# Patient Record
Sex: Male | Born: 1960 | Race: White | Hispanic: No | Marital: Married | Smoking: Never smoker
Health system: Southern US, Community
[De-identification: ages and names within clinical notes are randomized; demographics above are authoritative.]

## PROBLEM LIST (undated history)

## (undated) DIAGNOSIS — F419 Anxiety disorder, unspecified: Secondary | ICD-10-CM

## (undated) DIAGNOSIS — Z1211 Encounter for screening for malignant neoplasm of colon: Secondary | ICD-10-CM

## (undated) DIAGNOSIS — E039 Hypothyroidism, unspecified: Secondary | ICD-10-CM

## (undated) DIAGNOSIS — H269 Unspecified cataract: Secondary | ICD-10-CM

## (undated) DIAGNOSIS — Z87442 Personal history of urinary calculi: Secondary | ICD-10-CM

## (undated) DIAGNOSIS — H5500 Unspecified nystagmus: Secondary | ICD-10-CM

## (undated) DIAGNOSIS — N200 Calculus of kidney: Secondary | ICD-10-CM

## (undated) DIAGNOSIS — Z8619 Personal history of other infectious and parasitic diseases: Secondary | ICD-10-CM

## (undated) DIAGNOSIS — J45909 Unspecified asthma, uncomplicated: Secondary | ICD-10-CM

## (undated) HISTORY — PX: CARDIAC CATHETERIZATION: SHX172

## (undated) HISTORY — DX: Calculus of kidney: N20.0

## (undated) HISTORY — DX: Personal history of other infectious and parasitic diseases: Z86.19

## (undated) HISTORY — DX: Encounter for screening for malignant neoplasm of colon: Z12.11

## (undated) HISTORY — DX: Unspecified asthma, uncomplicated: J45.909

## (undated) HISTORY — PX: EXTRACORPOREAL SHOCK WAVE LITHOTRIPSY: SHX1557

## (undated) HISTORY — DX: Anxiety disorder, unspecified: F41.9

## (undated) HISTORY — PX: PAROTID GLAND TUMOR EXCISION: SHX5221

## (undated) HISTORY — PX: URETEROLITHOTOMY: SHX71

## (undated) HISTORY — DX: Hypothyroidism, unspecified: E03.9

## (undated) HISTORY — DX: Unspecified nystagmus: H55.00

## (undated) HISTORY — PX: OTHER SURGICAL HISTORY: SHX169

## (undated) HISTORY — DX: Unspecified cataract: H26.9

---

## 2003-08-21 ENCOUNTER — Ambulatory Visit (HOSPITAL_COMMUNITY): Admission: RE | Admit: 2003-08-21 | Discharge: 2003-08-21 | Payer: Self-pay | Admitting: Urology

## 2009-01-29 ENCOUNTER — Encounter: Payer: Self-pay | Admitting: Cardiology

## 2009-02-27 ENCOUNTER — Ambulatory Visit: Payer: Self-pay | Admitting: Cardiology

## 2009-02-27 DIAGNOSIS — E785 Hyperlipidemia, unspecified: Secondary | ICD-10-CM | POA: Insufficient documentation

## 2009-03-02 ENCOUNTER — Ambulatory Visit: Payer: Self-pay | Admitting: Cardiology

## 2009-03-02 ENCOUNTER — Ambulatory Visit: Payer: Self-pay

## 2009-03-02 DIAGNOSIS — R079 Chest pain, unspecified: Secondary | ICD-10-CM | POA: Insufficient documentation

## 2009-03-05 ENCOUNTER — Encounter: Payer: Self-pay | Admitting: Cardiology

## 2009-03-05 ENCOUNTER — Ambulatory Visit: Payer: Self-pay

## 2009-03-05 DIAGNOSIS — R9439 Abnormal result of other cardiovascular function study: Secondary | ICD-10-CM | POA: Insufficient documentation

## 2009-03-05 LAB — CONVERTED CEMR LAB: CRP, High Sensitivity: 1.6 (ref 0.00–5.00)

## 2009-03-08 ENCOUNTER — Telehealth: Payer: Self-pay | Admitting: Cardiology

## 2009-03-11 ENCOUNTER — Observation Stay (HOSPITAL_COMMUNITY): Admission: EM | Admit: 2009-03-11 | Discharge: 2009-03-12 | Payer: Self-pay | Admitting: Emergency Medicine

## 2009-03-11 ENCOUNTER — Ambulatory Visit: Payer: Self-pay | Admitting: Cardiology

## 2009-07-25 ENCOUNTER — Telehealth: Payer: Self-pay | Admitting: Cardiology

## 2009-09-17 ENCOUNTER — Telehealth: Payer: Self-pay | Admitting: Cardiology

## 2009-11-02 ENCOUNTER — Telehealth: Payer: Self-pay | Admitting: Cardiology

## 2009-12-28 ENCOUNTER — Telehealth: Payer: Self-pay | Admitting: Cardiology

## 2010-02-12 ENCOUNTER — Telehealth: Payer: Self-pay | Admitting: Cardiology

## 2010-03-29 ENCOUNTER — Telehealth: Payer: Self-pay | Admitting: Cardiology

## 2010-04-04 ENCOUNTER — Telehealth: Payer: Self-pay | Admitting: Cardiology

## 2010-04-05 ENCOUNTER — Telehealth (INDEPENDENT_AMBULATORY_CARE_PROVIDER_SITE_OTHER): Payer: Self-pay | Admitting: *Deleted

## 2010-07-09 NOTE — Progress Notes (Signed)
Summary: refill request  Phone Note Refill Request Message from:  Patient on February 12, 2010 3:30 PM  Refills Requested: Medication #1:  PAXIL 20 MG TABS 1 daily [facility] [address]   Method Requested: Telephone to Pharmacy Initial call taken by: Glynda Jaeger,  February 12, 2010 3:31 PM  Follow-up for Phone Call       Follow-up by: Judithe Modest CMA,  February 12, 2010 3:43 PM    Prescriptions: PAXIL 20 MG TABS (PAROXETINE HCL) 1 daily  #30 Tablet x 0   Entered by:   Judithe Modest CMA   Authorized by:   Marca Ancona, MD   Signed by:   Judithe Modest CMA on 02/12/2010   Method used:   [facility]ically [facility]        [facility]  [address] [id number]       [address]       [place], [place]  [address]       Ph: [phone] or [phone]       Fax: [phone]   RxID:   [id number]

## 2010-07-09 NOTE — Progress Notes (Signed)
  Phone Note Outgoing Call   Call placed by: Judithe Modest CMA,  April 05, 2010 11:45 AM Summary of Call: Called pt regarding refill request for pts paxil.  Pt at work, but spouse was concerned about pt being out of his medication and said he had been trying to get it for a few days.  Told her I would send in another 30 day supply but that patient needed PCP for this Rx in the future.  She said that she was sure her husband would not take initiative to make an appt and that she would appreciate [place] contacting PCP at [facility] and referring him.   Initial call taken by: Judithe Modest CMA,  April 05, 2010 11:50 AM  Follow-up for Phone Call       Follow-up by: Judithe Modest CMA,  April 05, 2010 11:45 AM     Appended Document:  Referral was to be made by scheduling.

## 2010-07-09 NOTE — Progress Notes (Signed)
Summary: refill  Phone Note Refill Request Message from:  Patient on April 04, 2010 2:35 PM  Refills Requested: Medication #1:  PAXIL 20 MG TABS 1 [phone]  Initial call taken by: Judie Grieve,  April 04, 2010 2:35 PM  Follow-up for Phone Call       Follow-up by: Judithe Modest CMA,  April 05, 2010 11:41 AM    Prescriptions: PAXIL 20 MG TABS (PAROXETINE HCL) 1 daily  #30 Tablet x 0   Entered by:   Judithe Modest CMA   Authorized by:   Marca Ancona, MD   Signed by:   Judithe Modest CMA on 04/05/2010   Method used:   [facility]ronically [facility]        [facility]  [address] [id number]       [address]       [place], [place]  [address]       Ph: [phone] or [phone]       Fax: [phone]   RxID:   [id number]

## 2010-07-09 NOTE — Progress Notes (Signed)
Summary: refill  Phone Note Refill Request Message from:  Patient on July 25, 2009 1:25 PM  Refills Requested: Medication #1:  PAXIL 20 MG TABS 1 daily   [facility] Requested: [facility] [address]   Method Requested: Fax to Local Pharmacy Initial call taken by: Migdalia Dk,  July 25, 2009 1:25 PM  Follow-up for Phone Call       Follow-up by: Judithe Modest CMA,  July 25, 2009 4:06 PM    Prescriptions: PAXIL 20 MG TABS (PAROXETINE HCL) 1 daily  #30 Tablet x 0   Entered by:   Judithe Modest CMA   Authorized by:   Marca Ancona, MD   Signed by:   Judithe Modest CMA on 07/25/2009   Method used:   [facility]ronically to        [facility]  [address] [id number]       [address]       [place], [place]  [address]       Ph: [phone] or [phone]       Fax: [phone]   RxID:   [id number]

## 2010-07-09 NOTE — Progress Notes (Signed)
Summary: refill  Phone Note Refill Request Message from:  Patient on Nov 02, 2009 2:20 PM  Refills Requested: Medication #1:  PAXIL 20 MG TABS 1 daily   [facility]ly Requested: [facility] months [facility] [address]   Method Requested: Fax to Local Pharmacy Initial call taken by: Migdalia Dk,  Nov 02, 2009 2:21 PM  Follow-up for Phone Call       Follow-up by: Judithe Modest CMA,  Nov 02, 2009 2:59 PM    Prescriptions: PAXIL 20 MG TABS (PAROXETINE HCL) 1 daily  #30 Tablet x 0   Entered by:   Judithe Modest CMA   Authorized by:   Marca Ancona, MD   Signed by:   Judithe Modest CMA on 11/02/2009   Method used:   Electronically to        [facility]  [address] [id number]       [address]       [place], [place]  [address]       Ph: [phone] or [phone]       Fax: [phone]   RxID:   [id number]

## 2010-07-09 NOTE — Progress Notes (Signed)
Summary: pt needs refill  Phone Note Refill Request Call back at Home Phone [phone] Message from:  [facility] [address]  Refills Requested: Medication #1:  PAXIL 20 MG TABS 1 daily Initial call taken by: Omer Jack,  December 28, 2009 1:24 PM  Follow-up for Phone Call       Follow-up by: Judithe Modest CMA,  December 28, 2009 3:08 PM    Prescriptions: PAXIL 20 MG TABS (PAROXETINE HCL) 1 daily  #30 Tablet x 0   Entered by:   Judithe Modest CMA   Authorized by:   Marca Ancona, MD   Signed by:   Judithe Modest CMA on 12/28/2009   Method used:   Electronically to        [facility]  [address] [id number]       [address]       [place], [place]  [address]       Ph: [phone] or [phone]       Fax: [phone]   RxID:   [id number]

## 2010-07-09 NOTE — Progress Notes (Signed)
Summary: REFILL  Phone Note Refill Request Message from:  Patient on September 17, 2009 10:18 AM  Refills Requested: Medication #1:  PAXIL 20 MG TABS 1 daily [facility] [address] [phone] 90 DAY SUPPLY  Initial call taken by: Judie Grieve,  September 17, 2009 10:18 AM  Follow-up for Phone Call       Follow-up by: Judithe Modest CMA,  September 17, 2009 4:20 PM    Prescriptions: PAXIL 20 MG TABS (PAROXETINE HCL) 1 daily  #30 Tablet x 0   Entered by:   Judithe Modest CMA   Authorized by:   Marca Ancona, MD   Signed by:   Judithe Modest CMA on 09/17/2009   Method used:   Electronically to        [facility]  [address] [id number]       [address]       [place], [place]  [address]       Ph: [phone] or [phone]       Fax: [phone]   RxID:   [id number]

## 2010-07-09 NOTE — Progress Notes (Signed)
Summary: refill  Phone Note Refill Request Message from:  Patient on March 29, 2010 2:28 PM  Refills Requested: Medication #1:  PAXIL 20 MG TABS 1 daily [facility] [phone]  Initial call taken by: Judie Grieve,  March 29, 2010 2:28 PM

## 2010-08-27 ENCOUNTER — Encounter: Payer: Self-pay | Admitting: Internal Medicine

## 2010-08-27 ENCOUNTER — Ambulatory Visit (INDEPENDENT_AMBULATORY_CARE_PROVIDER_SITE_OTHER): Payer: BC Managed Care – PPO | Admitting: Internal Medicine

## 2010-08-27 ENCOUNTER — Ambulatory Visit: Payer: BC Managed Care – PPO

## 2010-08-27 DIAGNOSIS — Z0000 Encounter for general adult medical examination without abnormal findings: Secondary | ICD-10-CM

## 2010-08-27 DIAGNOSIS — I208 Other forms of angina pectoris: Secondary | ICD-10-CM | POA: Insufficient documentation

## 2010-08-27 DIAGNOSIS — E785 Hyperlipidemia, unspecified: Secondary | ICD-10-CM

## 2010-08-27 DIAGNOSIS — N200 Calculus of kidney: Secondary | ICD-10-CM | POA: Insufficient documentation

## 2010-08-27 LAB — LIPID PANEL
Cholesterol: 217 mg/dL — ABNORMAL HIGH (ref 0–200)
HDL: 34.7 mg/dL — ABNORMAL LOW (ref >39.00)
Triglycerides: 117 mg/dL (ref 0.0–149.0)

## 2010-08-27 LAB — BASIC METABOLIC PANEL
BUN: 13 mg/dL (ref 6–23)
CO2: 28 mEq/L (ref 19–32)
Calcium: 9.7 mg/dL (ref 8.4–10.5)
Creatinine, Ser: 1 mg/dL (ref 0.4–1.5)
Glucose, Bld: 85 mg/dL (ref 70–99)

## 2010-08-27 LAB — LDL CHOLESTEROL, DIRECT: Direct LDL: 156.7 mg/dL

## 2010-08-27 NOTE — Progress Notes (Signed)
Subjective:    Patient ID: Kevin Kirby, male    DOB: Jan 14, 1961, 50 y.o.   MRN: [id number]  HPI  Patient presents to establish for on-going care.  He has a h/o anxiety. He had chest pain last year and was seen by Dr. Shirlee Latch. He had abnormal GXT and abnormal NST. He did start on cardiac meds but found this to be intolerable due in large part to anxiety. He had a subsequent panic attack about cardiac disease and went to ED. He was seen by Dr. Antoine Poche and did come to cardiac catherization which was normal. Reviewed old record: cath was totally normal with no blockage in any vessel. Outside lab did reveal a cholesterol 204; HDL 38 (goal 40+) LDL 148 (goal is 130 or less).  He reports that he has not tried any other medication for anxiety except for paxil. May be having some sexual dysfunction related to medication.   He reports that he has started using reading glasses, especially with computer use.    Past Medical History  Diagnosis Date  . Asthma     has outgrown to a great extent. Not on treatment  . Nephrolithiasis     has had 3 stone: 1 passed, 1 extracted, 1 ECSWL  . Anxiety   . 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  Past Surgical History  Procedure Date  . Ureteral lithotripsy    History   Social History  . Marital Status: Married    Spouse Name: N/A    Number of Children: N/A  . Years of Education: N/A   Social History Main Topics  . Smoking status: Never Smoker   . Smokeless tobacco: None  . Alcohol Use: 0.5 oz/week    1 drink(s) per week  . Drug Use: None  . Sexually Active: None   Other Topics Concern  . None   Social History Narrative  . None           Review of Systems  Constitutional: Positive for fatigue. Negative for activity change and appetite change.  HENT: Negative.   Eyes: Negative.   Respiratory: Negative.   Cardiovascular: Negative.   Gastrointestinal: Negative.   Genitourinary: Negative for urgency, frequency  and flank pain.  Musculoskeletal: Negative.   Neurological: Negative.   Hematological: Negative.   Psychiatric/Behavioral: Negative.        Objective:   Physical Exam  Constitutional: He is oriented to person, place, and time. He appears well-developed and well-nourished.  HENT:  Head: Normocephalic and atraumatic.  Right Ear: External ear normal.  Left Ear: External ear normal.  Nose: Nose normal.  Mouth/Throat: Oropharynx is clear and moist.  Eyes: Conjunctivae and EOM are normal. Pupils are equal, round, and reactive to light.  Neck: Normal range of motion. Neck supple. No tracheal deviation present. No thyromegaly present.  Cardiovascular: Normal rate, normal heart sounds and intact distal pulses.  Exam reveals no friction rub.   No murmur heard. Pulmonary/Chest: Effort normal and breath sounds normal. He exhibits no tenderness.  Abdominal: Soft. Bowel sounds are normal. He exhibits no mass. There is no guarding.  Musculoskeletal: Normal range of motion. He exhibits no edema and no tenderness.  Lymphadenopathy:    He has no cervical adenopathy.  Neurological: He is alert and oriented to person, place, and time. He has normal reflexes. Coordination normal.  Skin: Skin is warm and dry. No rash noted.  Psychiatric: He has a normal mood and  affect. His behavior is normal. Judgment and thought content normal.          Assessment & Plan:  1. Anxiety - patient currently on Paxil. He has some concerns about side effects. Did discuss possible counseling  Plan - he will return in 2 weeks and we will further discuss medication management and possible referral  2. Lipids - patient had modestly elevated LDL in '10  Plan - repeat lipid panel with recommendations to follow.

## 2010-08-28 ENCOUNTER — Telehealth: Payer: Self-pay | Admitting: *Deleted

## 2010-08-28 NOTE — Telephone Encounter (Signed)
Message copied by [facility], Nas Wafer on Wed Aug 28, 2010  1:04 PM ------      Message from: Illene Regulus E      Created: Wed Aug 28, 2010 10:26 AM       Call patient with information about test result:             Please call patient to let him know that his LDL was 156, slightly higher than last study. Will discuss at F/u ov            Thanks

## 2010-08-28 NOTE — Telephone Encounter (Signed)
Spoke with pt and informed him of lab results. Pt has follow up appt April 3rd at 3pm. Pt is aware

## 2010-09-10 ENCOUNTER — Ambulatory Visit (INDEPENDENT_AMBULATORY_CARE_PROVIDER_SITE_OTHER): Payer: BC Managed Care – PPO | Admitting: Internal Medicine

## 2010-09-10 DIAGNOSIS — F419 Anxiety disorder, unspecified: Secondary | ICD-10-CM | POA: Insufficient documentation

## 2010-09-10 DIAGNOSIS — F411 Generalized anxiety disorder: Secondary | ICD-10-CM

## 2010-09-10 DIAGNOSIS — R9439 Abnormal result of other cardiovascular function study: Secondary | ICD-10-CM

## 2010-09-10 DIAGNOSIS — E785 Hyperlipidemia, unspecified: Secondary | ICD-10-CM

## 2010-09-10 MED ORDER — SERTRALINE HCL 50 MG PO TABS: 50.0000 mg | ORAL_TABLET | Freq: Every day | ORAL | Status: AC

## 2010-09-10 NOTE — Progress Notes (Signed)
  Subjective:    Patient ID: Kevin Kirby, male    DOB: 1960/09/16, 50 y.o.   MRN: [id number]  HPIMr. Kirby was seen as a new patient about 2 weeks ago. In the interval he has resumed paxil and does report that his anxiety is better. He did have a lipid panel with an LDL 56. He presents today to discuss these two issues.   Last office note was reviewed with him and there are no additions or corrections.    Review of Systems  [all other systems reviewed and are negative       Objective:   Physical Exam  Constitutional: He is oriented to person, place, and time. He appears well-developed and well-nourished. No distress.  HENT:  Head: Normocephalic and atraumatic.  Right Ear: Tympanic membrane, external ear and ear canal normal. No decreased hearing is noted.  Left Ear: Tympanic membrane, external ear and ear canal normal. No decreased hearing is noted.  Nose: Nose normal.  Mouth/Throat: Oropharynx is clear and moist.  Eyes: Conjunctivae, EOM and lids are normal. Pupils are equal, round, and reactive to light.  Fundoscopic exam:      The right eye shows no arteriolar narrowing, no AV nicking and no papilledema.       The left eye shows no arteriolar narrowing, no AV nicking and no papilledema.       Rapid horizontal nystagmus is noted bilaterally  Neck: Normal range of motion. Neck supple. No JVD present. No tracheal deviation present. No thyromegaly present.  Cardiovascular: Normal rate, regular rhythm, normal heart sounds and intact distal pulses.  Exam reveals no friction rub.   No murmur heard. Pulmonary/Chest: Effort normal and breath sounds normal. No respiratory distress. He has no rales. He exhibits no tenderness.  Abdominal: Soft. Bowel sounds are normal. He exhibits no distension and no mass. There is no tenderness. There is no guarding.       Moderately overweight  Musculoskeletal: Normal range of motion. He exhibits no edema and no tenderness.  Lymphadenopathy:    He  has no cervical adenopathy.  Neurological: He is alert and oriented to person, place, and time. He has normal reflexes. No cranial nerve deficit. Coordination normal.  Skin: Skin is warm and dry. No rash noted.  Psychiatric: He has a normal mood and affect. His behavior is normal. Thought content normal.          Assessment & Plan:  1. Hyperlipidemia - discussed NCEP-ATP III guidelines - threshold for medical treatment being LDL 160+. He has clean coronary arteries by recent cath. He prefers life-style management.  Plan - low fat diet, regular exercise and weight loss           Repeat lab in 6 months: orders entered for Sept 3, 2012  2. Anxiety - he is doing better on SSRI, however we discussed the disadvantages of paxil with a very short half-life.  Plan - change to sertraline 50mg  once a day.

## 2010-09-12 LAB — POCT CARDIAC MARKERS
CKMB, poc: <1 ng/mL — ABNORMAL LOW (ref 1.0–8.0)
Myoglobin, poc: 73.4 ng/mL (ref 12–200)

## 2010-09-12 LAB — CBC
MCV: 91.9 fL (ref 78.0–100.0)
RBC: 4.72 MIL/uL (ref 4.22–5.81)
WBC: 6.1 10*3/uL (ref 4.0–10.5)

## 2010-09-12 LAB — BASIC METABOLIC PANEL
Chloride: 103 mEq/L (ref 96–112)
Creatinine, Ser: 1.03 mg/dL (ref 0.4–1.5)
GFR calc Af Amer: >60 mL/min (ref >60)
Potassium: 3.6 mEq/L (ref 3.5–5.1)

## 2010-09-12 LAB — DIFFERENTIAL
Eosinophils Absolute: 0.2 10*3/uL (ref 0.0–0.7)
Lymphs Abs: 1.1 10*3/uL (ref 0.7–4.0)
Monocytes Relative: 7 % (ref 3–12)
Neutrophils Relative %: 72 % (ref 43–77)

## 2010-09-12 LAB — PROTIME-INR: Prothrombin Time: 13.9 seconds (ref 11.6–15.2)

## 2010-09-12 LAB — CK TOTAL AND CKMB (NOT AT ARMC)
CK, MB: 0.6 ng/mL (ref 0.3–4.0)
CK, MB: 1.1 ng/mL (ref 0.3–4.0)
Relative Index: INVALID (ref 0.0–2.5)
Relative Index: INVALID (ref 0.0–2.5)
Total CK: 113 U/L (ref 7–232)
Total CK: 90 U/L (ref 7–232)

## 2010-09-12 LAB — TROPONIN I: Troponin I: 0.03 ng/mL (ref 0.00–0.06)

## 2012-06-08 ENCOUNTER — Ambulatory Visit (INDEPENDENT_AMBULATORY_CARE_PROVIDER_SITE_OTHER): Payer: BC Managed Care – PPO | Admitting: Emergency Medicine

## 2012-06-08 ENCOUNTER — Ambulatory Visit: Payer: BC Managed Care – PPO

## 2012-06-08 VITALS — BP 126/80 | HR 86 | Temp 97.6°F | Resp 16 | Ht 70.0 in | Wt 209.0 lb

## 2012-06-08 DIAGNOSIS — N200 Calculus of kidney: Secondary | ICD-10-CM

## 2012-06-08 DIAGNOSIS — R300 Dysuria: Secondary | ICD-10-CM

## 2012-06-08 LAB — POCT UA - MICROSCOPIC ONLY: Crystals, Ur, HPF, POC: NEGATIVE

## 2012-06-08 LAB — POCT URINALYSIS DIPSTICK
Protein, UA: 30
Spec Grav, UA: 1.025

## 2012-06-08 MED ORDER — CIPROFLOXACIN HCL 500 MG PO TABS: 500.0000 mg | ORAL_TABLET | Freq: Two times a day (BID) | ORAL | Status: DC

## 2012-06-08 NOTE — Progress Notes (Signed)
  Subjective:    Patient ID: Kevin Kirby, male    DOB: 01/01/1961, 51 y.o.   MRN: [id number]  HPI patient enters with multiple complaints his long history of kidney stones has had a basket extraction in the past and had lithotripsy in the past. He states he has known retained kidney stones. He enters with an urgency to urinate. He has had some burning when he urinates. He has no straining. He denies any discharge.    Review of Systems     Objective:   Physical Exam patient is alert and cooperative in no distress. His abdomen is soft without tenderness. There is no CVA tenderness. Genital exam reveals no testicular tenderness and no swelling. I did not see a discharge.  [facility] reading (PRIMARY) by  Dr. Cleta Alberts   Results for orders placed in visit on 06/08/12  POCT UA - MICROSCOPIC ONLY      Component Value Range   WBC, Ur, HPF, POC 8-20     RBC, urine, microscopic 16-26     Bacteria, U Microscopic 2+     Mucus, UA neg     Epithelial cells, urine per micros 1-3     Crystals, Ur, HPF, POC neg     Casts, Ur, LPF, POC neg     Yeast, UA neg    POCT URINALYSIS DIPSTICK      Component Value Range   Color, UA amber     Clarity, UA cloudy     Glucose, UA neg     Bilirubin, UA small     Ketones, UA trace     Spec Grav, UA 1.025     Blood, UA moderate     pH, UA 6.0     Protein, UA 30     Urobilinogen, UA 1.0     Nitrite, UA neg     Leukocytes, UA small (1+)     [facility] reading (PRIMARY) by  Dr. Cleta Alberts there is a 4 mm stone in the inferior pole of the left kidney       Assessment & Plan:    Urine will be cultured. He will strain his urine. Glenford Peers probe was also done. We'll treat with Cipro 500 twice a day.

## 2012-06-09 LAB — GC/CHLAMYDIA PROBE AMP, URINE: Chlamydia, Swab/Urine, PCR: NEGATIVE

## 2012-06-11 LAB — URINE CULTURE: Colony Count: >=100000

## 2012-12-11 ENCOUNTER — Emergency Department (HOSPITAL_COMMUNITY)
Admission: EM | Admit: 2012-12-11 | Discharge: 2012-12-11 | Disposition: A | Discharge status: Home / Self Care | Payer: BC Managed Care – PPO | Attending: Emergency Medicine | Admitting: Emergency Medicine

## 2012-12-11 ENCOUNTER — Emergency Department (HOSPITAL_COMMUNITY): Payer: BC Managed Care – PPO

## 2012-12-11 ENCOUNTER — Encounter (HOSPITAL_COMMUNITY): Payer: Self-pay | Admitting: Emergency Medicine

## 2012-12-11 DIAGNOSIS — Z7982 Long term (current) use of aspirin: Secondary | ICD-10-CM | POA: Insufficient documentation

## 2012-12-11 DIAGNOSIS — N23 Unspecified renal colic: Secondary | ICD-10-CM | POA: Insufficient documentation

## 2012-12-11 DIAGNOSIS — Z79899 Other long term (current) drug therapy: Secondary | ICD-10-CM | POA: Insufficient documentation

## 2012-12-11 DIAGNOSIS — J45909 Unspecified asthma, uncomplicated: Secondary | ICD-10-CM | POA: Insufficient documentation

## 2012-12-11 DIAGNOSIS — Z87442 Personal history of urinary calculi: Secondary | ICD-10-CM | POA: Insufficient documentation

## 2012-12-11 DIAGNOSIS — F411 Generalized anxiety disorder: Secondary | ICD-10-CM | POA: Insufficient documentation

## 2012-12-11 DIAGNOSIS — R1110 Vomiting, unspecified: Secondary | ICD-10-CM | POA: Insufficient documentation

## 2012-12-11 DIAGNOSIS — I209 Angina pectoris, unspecified: Secondary | ICD-10-CM | POA: Insufficient documentation

## 2012-12-11 LAB — POCT I-STAT, CHEM 8
BUN: 13 mg/dL (ref 6–23)
Creatinine, Ser: 1 mg/dL (ref 0.50–1.35)
Glucose, Bld: 111 mg/dL — ABNORMAL HIGH (ref 70–99)
Potassium: 4.1 mEq/L (ref 3.5–5.1)
Sodium: 143 mEq/L (ref 135–145)

## 2012-12-11 LAB — URINE MICROSCOPIC-ADD ON

## 2012-12-11 LAB — URINALYSIS, ROUTINE W REFLEX MICROSCOPIC
Bilirubin Urine: NEGATIVE
Glucose, UA: NEGATIVE mg/dL
Specific Gravity, Urine: 1.029 (ref 1.005–1.030)

## 2012-12-11 MED ORDER — OXYCODONE-ACETAMINOPHEN 5-325 MG PO TABS
1.0000 | ORAL_TABLET | Freq: Once | ORAL | Status: AC
  Administered 2012-12-11: 1 via ORAL
  Filled 2012-12-11: qty 1

## 2012-12-11 MED ORDER — OXYCODONE-ACETAMINOPHEN 5-325 MG PO TABS: 2.0000 | ORAL_TABLET | Freq: Four times a day (QID) | ORAL | Status: DC | PRN

## 2012-12-11 MED ORDER — ONDANSETRON 8 MG PO TBDP
8.0000 mg | ORAL_TABLET | Freq: Once | ORAL | Status: AC
  Administered 2012-12-11: 8 mg via ORAL
  Filled 2012-12-11: qty 1

## 2012-12-11 MED ORDER — ONDANSETRON HCL 8 MG PO TABS: 8.0000 mg | ORAL_TABLET | Freq: Three times a day (TID) | ORAL | Status: DC | PRN

## 2012-12-11 NOTE — ED Provider Notes (Addendum)
History    CSN: [id number] Arrival date & time 12/11/12  5284  First MD Initiated Contact with Patient 12/11/12 [phone]     Chief Complaint  Patient presents with  . Abdominal Pain   (Consider location/radiation/quality/duration/timing/severity/associated sxs/prior Treatment) HPI  complains of abdominal pain, left-sided, suprapubic, nonradiating onset approximately one week ago. It is a kidney stones had in the past. Patient reports tramadol this morning. Vomited this morning. One time. Seen [facility] 5 days ago for same complaint had CT scan which he reports was unremarkable. Presently pain is minimal. Treated himself with tramadol this morning. Denies nausea presently. Last bowel movement this morning, normal. No fever. No other associated symptoms. Nothing makes symptoms better or worse. Past Medical History  Diagnosis Date  . Asthma     has outgrown to a great extent. Not on treatment  . Nephrolithiasis     has had 3 stone: 1 passed, 1 extracted, 1 ECSWL  . Anxiety   . 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 Ureteral lithotripsy     Family History  Problem Relation Age of Onset  . Osteoarthritis Mother     DOB 48  . Deep vein thrombosis Mother   . Angina Father     decesed @ 1  . Aneurysm Sister     deceased@ 58  . Healthy Sister    History  Substance Use Topics  . Smoking status: Never Smoker   . Smokeless tobacco: Not on file  . Alcohol Use: 0.5 oz/week    1 drink(s) per week    Review of Systems  Constitutional: Negative.   HENT: Negative.   Respiratory: Negative.   Cardiovascular: Negative.   Gastrointestinal: Positive for vomiting and abdominal pain.  Musculoskeletal: Negative.   Skin: Negative.   Neurological: Negative.   Psychiatric/Behavioral: Negative.   All other systems reviewed and are negative.    Allergies  Review of patient's allergies indicates no known  allergies.  Home Medications   Current Outpatient Rx  Name  Route  Sig  Dispense  Refill  . aspirin 325 MG EC tablet   Oral   Take 325 mg by mouth daily.          . tamsulosin (FLOMAX) 0.4 MG CAPS   Oral   Take 0.4 mg by mouth daily.         . traMADol (ULTRAM) 50 MG tablet   Oral   Take 50 mg by mouth every 6 (six) hours as needed for pain.          BP 138/106  Temp(Src) 97.5 F (36.4 C)  Resp 14  SpO2 94% Physical Exam  Nursing note and vitals reviewed. Constitutional: He appears well-developed and well-nourished.  HENT:  Head: Normocephalic and atraumatic.  Eyes: Conjunctivae are normal. Pupils are equal, round, and reactive to light.  Neck: Neck supple. No tracheal deviation present. No thyromegaly present.  Cardiovascular: Normal rate and regular rhythm.   No murmur heard. Pulmonary/Chest: Effort normal and breath sounds normal.  Abdominal: Soft. Bowel sounds are normal. He exhibits no distension. There is no tenderness.  Genitourinary: Penis normal.  Scrotum normal  Musculoskeletal: Normal range of motion. He exhibits no edema and no tenderness.  Neurological: He is alert. Coordination normal.  Skin: Skin is warm and dry. No rash noted.  Psychiatric: He has a normal mood and affect.    ED Course  Procedures (including critical care time) Labs Reviewed  URINALYSIS, ROUTINE W REFLEX MICROSCOPIC   1:10 PM pain remains under control. No results found. No diagnosis found. Results for orders placed during the hospital encounter of 12/11/12  URINALYSIS, ROUTINE W REFLEX MICROSCOPIC      Result Value Range   Color, Urine YELLOW  YELLOW   APPearance CLEAR  CLEAR   Specific Gravity, Urine 1.029  1.005 - 1.030   pH 6.0  5.0 - 8.0   Glucose, UA NEGATIVE  NEGATIVE mg/dL   Hgb urine dipstick MODERATE (*) NEGATIVE   Bilirubin Urine NEGATIVE  NEGATIVE   Ketones, ur NEGATIVE  NEGATIVE mg/dL   Protein, ur 30 (*) NEGATIVE mg/dL   Urobilinogen, UA 0.2  0.0 - 1.0  mg/dL   Nitrite NEGATIVE  NEGATIVE   Leukocytes, UA NEGATIVE  NEGATIVE  URINE MICROSCOPIC-ADD ON      Result Value Range   Squamous Epithelial / LPF RARE  RARE   RBC / HPF 21-50  <3 RBC/hpf   Urine-Other MUCOUS PRESENT    POCT I-STAT, CHEM 8      Result Value Range   Sodium 143  135 - 145 mEq/L   Potassium 4.1  3.5 - 5.1 mEq/L   Chloride 108  96 - 112 mEq/L   BUN 13  6 - 23 mg/dL   Creatinine, Ser 1.61  0.50 - 1.35 mg/dL   Glucose, Bld 096 (*) 70 - 99 mg/dL   Calcium, Ion 0.45  4.09 - 1.23 mmol/L   TCO2 25  0 - 100 mmol/L   Hemoglobin 16.3  13.0 - 17.0 g/dL   HCT 81.1  91.4 - 78.2 %   US Renal  12/11/2012   *RADIOLOGY REPORT*  Clinical Data: Left flank pain, lithotripsy, kidney stones  RENAL/URINARY TRACT ULTRASOUND COMPLETE  Comparison:  None.  Findings:  Right Kidney:  Measures 11.5 cm.  No mass or hydronephrosis.  Left Kidney:  Measures 12.4 cm.  7 mm lower pole calculus.  Mild left hydronephrosis.  Bladder:  Within normal limits.  Bilateral bladder jets visualized.  IMPRESSION: 7 mm left lower pole renal calculus.  Mild left hydronephrosis.  Bilateral bladder jets visualized.   Original Report Authenticated By: Charline Bills, M.D.    MDM  Patient likely suffering ureteral colic i.e. hydronephrosis seen on renal ultrasound microscopic hematuria and colicky type pain. History of kidney stone. Plan prescription Zofran, Percocet Followup Dr. Margarita Grizzle next week. Diagnosis ureteral colic  Doug Sou, MD 12/11/12 1314  Doug Sou, MD 12/12/12 [phone]

## 2012-12-11 NOTE — ED Notes (Addendum)
Patient reports that last Monday he visited a urologist under the belief that the left lower abdominal pain was due to a kidney stone, which the MD did not believe was the cause. Patient vomited once this morning. Patient denies N/D, denies flank pain, denies dysuria.

## 2012-12-21 ENCOUNTER — Other Ambulatory Visit: Payer: Self-pay | Admitting: Urology

## 2012-12-23 ENCOUNTER — Other Ambulatory Visit: Payer: Self-pay | Admitting: Urology

## 2012-12-23 ENCOUNTER — Encounter (HOSPITAL_BASED_OUTPATIENT_CLINIC_OR_DEPARTMENT_OTHER): Payer: Self-pay | Admitting: *Deleted

## 2012-12-24 ENCOUNTER — Encounter (HOSPITAL_BASED_OUTPATIENT_CLINIC_OR_DEPARTMENT_OTHER): Payer: Self-pay | Admitting: *Deleted

## 2012-12-24 NOTE — Progress Notes (Signed)
NPO AFTER MN WITH EXCEPTION CLEAR LIQUIDS UNTIL 0800 (NO CREAM/ MILK PRODUCTS). ARRIVES AT 1245. NEEDS HG. MAY TAKE OXYCODONE OR TRAMADOL/ ZOFRAN IF NEEDED W/ SIPS OF WATER AM OF SURG .

## 2012-12-27 ENCOUNTER — Ambulatory Visit (HOSPITAL_BASED_OUTPATIENT_CLINIC_OR_DEPARTMENT_OTHER): Admission: RE | Admit: 2012-12-27 | Payer: BC Managed Care – PPO | Source: Ambulatory Visit | Admitting: Urology

## 2012-12-27 ENCOUNTER — Encounter (HOSPITAL_BASED_OUTPATIENT_CLINIC_OR_DEPARTMENT_OTHER): Admission: RE | Payer: Self-pay | Source: Ambulatory Visit

## 2012-12-27 HISTORY — DX: Personal history of urinary calculi: Z87.442

## 2012-12-27 SURGERY — CYSTOSCOPY, WITH STENT INSERTION
Anesthesia: General | Laterality: Left

## 2012-12-28 ENCOUNTER — Other Ambulatory Visit: Payer: Self-pay | Admitting: Urology

## 2012-12-29 ENCOUNTER — Encounter (HOSPITAL_BASED_OUTPATIENT_CLINIC_OR_DEPARTMENT_OTHER)
Admission: RE | Disposition: A | Discharge status: Home / Self Care | Payer: Self-pay | Source: Ambulatory Visit | Attending: Urology

## 2012-12-29 ENCOUNTER — Ambulatory Visit (HOSPITAL_BASED_OUTPATIENT_CLINIC_OR_DEPARTMENT_OTHER)
Admission: RE | Admit: 2012-12-29 | Discharge: 2012-12-29 | Disposition: A | Discharge status: Home / Self Care | Payer: BC Managed Care – PPO | Source: Ambulatory Visit | Attending: Urology | Admitting: Urology

## 2012-12-29 ENCOUNTER — Encounter (HOSPITAL_BASED_OUTPATIENT_CLINIC_OR_DEPARTMENT_OTHER): Payer: Self-pay | Admitting: Anesthesiology

## 2012-12-29 ENCOUNTER — Ambulatory Visit (HOSPITAL_COMMUNITY): Payer: BC Managed Care – PPO

## 2012-12-29 ENCOUNTER — Encounter (HOSPITAL_BASED_OUTPATIENT_CLINIC_OR_DEPARTMENT_OTHER): Payer: Self-pay | Admitting: *Deleted

## 2012-12-29 ENCOUNTER — Ambulatory Visit (HOSPITAL_BASED_OUTPATIENT_CLINIC_OR_DEPARTMENT_OTHER): Payer: BC Managed Care – PPO | Admitting: Anesthesiology

## 2012-12-29 DIAGNOSIS — N135 Crossing vessel and stricture of ureter without hydronephrosis: Secondary | ICD-10-CM | POA: Insufficient documentation

## 2012-12-29 DIAGNOSIS — S3710XA Unspecified injury of ureter, initial encounter: Secondary | ICD-10-CM | POA: Insufficient documentation

## 2012-12-29 DIAGNOSIS — X58XXXA Exposure to other specified factors, initial encounter: Secondary | ICD-10-CM | POA: Insufficient documentation

## 2012-12-29 DIAGNOSIS — N200 Calculus of kidney: Secondary | ICD-10-CM

## 2012-12-29 HISTORY — PX: CYSTOSCOPY WITH RETROGRADE PYELOGRAM, URETEROSCOPY AND STENT PLACEMENT: SHX5789

## 2012-12-29 SURGERY — CYSTOURETEROSCOPY, WITH RETROGRADE PYELOGRAM AND STENT INSERTION
Anesthesia: General | Site: Ureter | Laterality: Left | Wound class: Clean Contaminated

## 2012-12-29 MED ORDER — LACTATED RINGERS IV SOLN
INTRAVENOUS | Status: DC
  Administered 2012-12-29 (×2): via INTRAVENOUS
  Filled 2012-12-29: qty 1000

## 2012-12-29 MED ORDER — BELLADONNA ALKALOIDS-OPIUM 16.2-60 MG RE SUPP
RECTAL | Status: DC | PRN
  Administered 2012-12-29: 1 via RECTAL

## 2012-12-29 MED ORDER — PROMETHAZINE HCL 25 MG/ML IJ SOLN
6.2500 mg | INTRAMUSCULAR | Status: DC | PRN
  Filled 2012-12-29: qty 1

## 2012-12-29 MED ORDER — ONDANSETRON HCL 4 MG/2ML IJ SOLN
INTRAMUSCULAR | Status: DC | PRN
  Administered 2012-12-29: 4 mg via INTRAVENOUS

## 2012-12-29 MED ORDER — MEPERIDINE HCL 25 MG/ML IJ SOLN
6.2500 mg | INTRAMUSCULAR | Status: DC | PRN
  Filled 2012-12-29: qty 1

## 2012-12-29 MED ORDER — OXYBUTYNIN CHLORIDE 5 MG PO TABS
5.0000 mg | ORAL_TABLET | Freq: Four times a day (QID) | ORAL | Status: DC | PRN
  Administered 2012-12-29: 5 mg via ORAL
  Filled 2012-12-29: qty 1

## 2012-12-29 MED ORDER — DEXAMETHASONE SODIUM PHOSPHATE 4 MG/ML IJ SOLN
INTRAMUSCULAR | Status: DC | PRN
  Administered 2012-12-29: 8 mg via INTRAVENOUS

## 2012-12-29 MED ORDER — SODIUM CHLORIDE 0.9 % IR SOLN
Status: DC | PRN
  Administered 2012-12-29: 6000 mL

## 2012-12-29 MED ORDER — PHENAZOPYRIDINE HCL 200 MG PO TABS
200.0000 mg | ORAL_TABLET | Freq: Three times a day (TID) | ORAL | Status: DC | PRN
Start: 2012-12-29 — End: 2016-12-26

## 2012-12-29 MED ORDER — ONDANSETRON HCL 4 MG PO TABS: 4.0000 mg | ORAL_TABLET | Freq: Four times a day (QID) | ORAL | Status: DC | PRN

## 2012-12-29 MED ORDER — FENTANYL CITRATE 0.05 MG/ML IJ SOLN
INTRAMUSCULAR | Status: DC | PRN
  Administered 2012-12-29 (×2): 50 ug via INTRAVENOUS

## 2012-12-29 MED ORDER — LIDOCAINE HCL (CARDIAC) 20 MG/ML IV SOLN
INTRAVENOUS | Status: DC | PRN
  Administered 2012-12-29: 50 mg via INTRAVENOUS

## 2012-12-29 MED ORDER — PHENAZOPYRIDINE HCL 200 MG PO TABS
200.0000 mg | ORAL_TABLET | Freq: Three times a day (TID) | ORAL | Status: DC
  Administered 2012-12-29: 200 mg via ORAL
  Filled 2012-12-29: qty 1

## 2012-12-29 MED ORDER — OXYCODONE HCL 5 MG/5ML PO SOLN
5.0000 mg | Freq: Once | ORAL | Status: AC | PRN
  Filled 2012-12-29: qty 5

## 2012-12-29 MED ORDER — PROPOFOL 10 MG/ML IV BOLUS
INTRAVENOUS | Status: DC | PRN
  Administered 2012-12-29: 200 mg via INTRAVENOUS

## 2012-12-29 MED ORDER — OXYCODONE HCL 5 MG PO TABS
5.0000 mg | ORAL_TABLET | Freq: Once | ORAL | Status: AC | PRN
  Administered 2012-12-29: 5 mg via ORAL
  Filled 2012-12-29: qty 1

## 2012-12-29 MED ORDER — ACETAMINOPHEN 10 MG/ML IV SOLN
1000.0000 mg | Freq: Once | INTRAVENOUS | Status: DC | PRN
  Filled 2012-12-29: qty 100

## 2012-12-29 MED ORDER — OXYBUTYNIN CHLORIDE 5 MG PO TABS: 5.0000 mg | ORAL_TABLET | Freq: Four times a day (QID) | ORAL | Status: DC | PRN

## 2012-12-29 MED ORDER — LIDOCAINE HCL 2 % EX GEL
CUTANEOUS | Status: DC | PRN
  Administered 2012-12-29: 1

## 2012-12-29 MED ORDER — MIDAZOLAM HCL 5 MG/5ML IJ SOLN
INTRAMUSCULAR | Status: DC | PRN
  Administered 2012-12-29: 2 mg via INTRAVENOUS

## 2012-12-29 MED ORDER — HYDROMORPHONE HCL PF 1 MG/ML IJ SOLN
0.2500 mg | INTRAMUSCULAR | Status: DC | PRN
  Administered 2012-12-29: 0.5 mg via INTRAVENOUS
  Filled 2012-12-29: qty 1

## 2012-12-29 MED ORDER — OXYCODONE-ACETAMINOPHEN 5-325 MG PO TABS: 2.0000 | ORAL_TABLET | ORAL | Status: DC | PRN

## 2012-12-29 MED ORDER — HYOSCYAMINE SULFATE 0.125 MG PO TABS: 0.1250 mg | ORAL_TABLET | ORAL | Status: DC | PRN

## 2012-12-29 MED ORDER — CEFAZOLIN SODIUM-DEXTROSE 2-3 GM-% IV SOLR
2.0000 g | INTRAVENOUS | Status: AC
  Administered 2012-12-29: 2 g via INTRAVENOUS
  Filled 2012-12-29: qty 50

## 2012-12-29 MED ORDER — SENNOSIDES-DOCUSATE SODIUM 8.6-50 MG PO TABS: 1.0000 | ORAL_TABLET | Freq: Two times a day (BID) | ORAL | Status: DC

## 2012-12-29 SURGICAL SUPPLY — 38 items
ADAPTER CATH URET PLST 4-6FR (CATHETERS) IMPLANT
BAG DRAIN URO-CYSTO SKYTR STRL (DRAIN) ×3 IMPLANT
BASKET LASER NITINOL 1.9FR (BASKET) IMPLANT
BASKET STNLS GEMINI 4WIRE 3FR (BASKET) IMPLANT
BASKET ZERO TIP NITINOL 2.4FR (BASKET) IMPLANT
BRUSH URET BIOPSY 3F (UROLOGICAL SUPPLIES) IMPLANT
CANISTER SUCT LVC 12 LTR MEDI- (MISCELLANEOUS) ×3 IMPLANT
CATH CLEAR GEL 3F BACKSTOP (CATHETERS) IMPLANT
CATH INTERMIT  6FR 70CM (CATHETERS) IMPLANT
CATH URET 5FR 28IN CONE TIP (BALLOONS)
CATH URET 5FR 28IN OPEN ENDED (CATHETERS) ×3 IMPLANT
CATH URET 5FR 70CM CONE TIP (BALLOONS) IMPLANT
CATH URET DUAL LUMEN 6-10FR 50 (CATHETERS) IMPLANT
CLOTH BEACON ORANGE TIMEOUT ST (SAFETY) ×3 IMPLANT
DRAPE CAMERA CLOSED 9X96 (DRAPES) ×3 IMPLANT
ELECT REM PT RETURN 9FT ADLT (ELECTROSURGICAL)
ELECTRODE REM PT RTRN 9FT ADLT (ELECTROSURGICAL) IMPLANT
GLOVE BIO SURGEON STRL SZ7 (GLOVE) ×3 IMPLANT
GLOVE ECLIPSE 7.0 STRL STRAW (GLOVE) ×3 IMPLANT
GLOVE INDICATOR 7.5 STRL GRN (GLOVE) ×3 IMPLANT
GOWN PREVENTION PLUS LG XLONG (DISPOSABLE) ×3 IMPLANT
GUIDEWIRE 0.038 PTFE COATED (WIRE) IMPLANT
GUIDEWIRE ANG ZIPWIRE 038X150 (WIRE) IMPLANT
GUIDEWIRE STR DUAL SENSOR (WIRE) ×6 IMPLANT
IV NS IRRIG 3000ML ARTHROMATIC (IV SOLUTION) ×6 IMPLANT
KIT BALLIN UROMAX 15FX10 (LABEL) IMPLANT
KIT BALLN UROMAX 15FX4 (MISCELLANEOUS) IMPLANT
KIT BALLN UROMAX 26 75X4 (MISCELLANEOUS)
LASER FIBER DISP (UROLOGICAL SUPPLIES) IMPLANT
OMNIPAQUE 350ML IMPLANT
PACK CYSTOSCOPY (CUSTOM PROCEDURE TRAY) ×3 IMPLANT
SET HIGH PRES BAL DIL (LABEL)
SHEATH ACCESS URETERAL 38CM (SHEATH) ×3 IMPLANT
SHEATH ACCESS URETERAL 54CM (SHEATH) ×3 IMPLANT
SHEATH URET ACCESS 12FR/35CM (UROLOGICAL SUPPLIES) IMPLANT
SHEATH URET ACCESS 12FR/55CM (UROLOGICAL SUPPLIES) IMPLANT
STENT 6X26 (STENTS) ×3 IMPLANT
SYRINGE IRR TOOMEY STRL 70CC (SYRINGE) IMPLANT

## 2012-12-29 NOTE — Anesthesia Preprocedure Evaluation (Addendum)
Anesthesia Evaluation  Patient identified by MRN, date of birth, ID band Patient awake    Reviewed: Allergy & Precautions, H&P , NPO status , Patient's Chart, lab work & pertinent test results  Airway Mallampati: I TM Distance: >3 FB Neck ROM: Full    Dental  (+) Dental Advisory Given and Teeth Intact   Pulmonary neg pulmonary ROS,  breath sounds clear to auscultation        Cardiovascular + angina with exertion Rhythm:Regular Rate:Normal  Cardiac cath 03-11-2009: normal coronary arteries, EF 65%   Neuro/Psych negative neurological ROS  negative psych ROS   GI/Hepatic negative GI ROS, Neg liver ROS,   Endo/Other  negative endocrine ROS  Renal/GU Renal diseaseStones      Musculoskeletal negative musculoskeletal ROS (+)   Abdominal   Peds  Hematology negative hematology ROS (+)   Anesthesia Other Findings   Reproductive/Obstetrics                         Anesthesia Physical Anesthesia Plan  ASA: II  Anesthesia Plan: General   Post-op Pain Management:    Induction: Intravenous  Airway Management Planned: LMA and Oral ETT  Additional Equipment:   Intra-op Plan:   Post-operative Plan: Extubation in OR  Informed Consent: I have reviewed the patients History and Physical, chart, labs and discussed the procedure including the risks, benefits and alternatives for the proposed anesthesia with the patient or authorized representative who has indicated his/her understanding and acceptance.   Dental advisory given  Plan Discussed with: CRNA  Anesthesia Plan Comments:         Anesthesia Quick Evaluation

## 2012-12-29 NOTE — Anesthesia Procedure Notes (Signed)
Procedure Name: LMA Insertion Date/Time: 12/29/2012 1:40 PM Performed by: Renella Cunas D Pre-anesthesia Checklist: Patient identified, Emergency Drugs available, Suction available and Patient being monitored Patient Re-evaluated:Patient Re-evaluated prior to inductionOxygen Delivery Method: Circle System Utilized Preoxygenation: Pre-oxygenation with 100% oxygen Intubation Type: IV induction Ventilation: Mask ventilation without difficulty LMA: LMA inserted LMA Size: 4.0 Number of attempts: 1 Airway Equipment and Method: bite block Placement Confirmation: positive ETCO2 Tube secured with: Tape Dental Injury: Teeth and Oropharynx as per pre-operative assessment

## 2012-12-29 NOTE — H&P (Signed)
Urology History and Physical Exam  CC: Left nephrolithiasis  HPI:   52 year old male presents with nephrolithiasis and possible left ureter stone. He presented to the clinic on 12/14/12 with left-sided flank pain. This is colicky in nature. It is controlled with pain medication. CT scan 11/30/12 revealed a left upper pole 3 mm stone in the left lower pole 13 mm stone. Ultrasound in the ER on 12/11/12 revealed hydronephrosis on the left side and a 7 mm calcification in the lower pole of the left kidney. We reviewed management options along with the risks, benefits, alternatives, and likelihood of achieving goals. He presents today for cystoscopy, left ureteroscopy, laser lithotripsy, left retrograde pyelogram, and possible left ureter stent placement. Our goal will be to remove any stones in the ureter and all the stones from his left kidney. Urine culture 12/22/12 was negative for growth.  PMH: Past Medical History  Diagnosis Date  . History of kidney stones   . Nephrolithiasis     LEFT   . Anxiety     HX PANIC ATTACKS    PSH: Past Surgical History  Procedure Laterality Date  . Ureterolithotomy    . Extracorporeal shock wave lithotripsy    . Cardiac catheterization  03-11-2009  [facility]    NORMAL CORONARY ARTERIES/  NORMAL LVF/ EF 65%  . Closed reduction left arm fx  AGE 87    Allergies: No Known Allergies  Medications: No prescriptions prior to admission     Social History: History   Social History  . Marital Status: Married    Spouse Name: N/A    Number of Children: N/A  . Years of Education: N/A   Occupational History  . Not on file.   Social History Main Topics  . Smoking status: Never Smoker   . Smokeless tobacco: Never Used  . Alcohol Use: 0.5 oz/week    1 drink(s) per week  . Drug Use: Not on file  . Sexually Active: Not on file   Other Topics Concern  . Not on file   Social History Narrative  . No narrative on file    Family History: Family  History  Problem Relation Age of Onset  . Osteoarthritis Mother     DOB 79  . Deep vein thrombosis Mother   . Angina Father     decesed @ 17  . Aneurysm Sister     deceased@ 58  . Healthy Sister     Review of Systems: Positive: Left flank pain. Negative: Fever, chest pain, or SOB.  A further 10 point review of systems was negative except what is listed in the HPI.  Physical Exam: Filed Vitals:   12/29/12 1258  BP: 126/83  Pulse: 68  Temp: 97.1 F (36.2 C)  Resp: 20    General: No acute distress.  Awake. Head:  Normocephalic.  Atraumatic. ENT:  EOMI.  Mucous membranes moist Neck:  Supple.  No lymphadenopathy. CV:  S1 present. S2 present. Regular rate. Pulmonary: Equal effort bilaterally.  Clear to auscultation bilaterally. Abdomen: Soft.  Non- tender to palpation. Skin:  Normal turgor.  No visible rash. Extremity: No gross deformity of bilateral upper extremities.  No gross deformity of    bilateral lower extremities. Neurologic: Alert. Appropriate mood.    Studies:  No results found for this basename: HGB, WBC, PLT,  in the last 72 hours  No results found for this basename: NA, K, CL, CO2, BUN, CREATININE, CALCIUM, MAGNESIUM, GFRNONAA, GFRAA,  in the last 72 hours  No results found for this basename: PT, INR, APTT,  in the last 72 hours   No components found with this basename: ABG,     Assessment:  Left nephrolithiasis  Plan: To OR  for cystoscopy, left ureteroscopy, laser lithotripsy, left retrograde pyelogram, and possible left ureter stent placement.

## 2012-12-29 NOTE — Transfer of Care (Signed)
Immediate Anesthesia Transfer of Care Note  Patient: Kevin Kirby  Procedure(s) Performed: Procedure(s) (LRB): CYSTOSCOPY WITH RETROGRADE PYELOGRAM, URETEROSCOPY AND STENT PLACEMENT (Left)  Patient Location: PACU  Anesthesia Type: General  Level of Consciousness: awake, oriented, sedated and patient cooperative  Airway & Oxygen Therapy: Patient Spontanous Breathing and Patient connected to face mask oxygen  Post-op Assessment: Report given to PACU RN and Post -op Vital signs reviewed and stable  Post vital signs: Reviewed and stable  Complications: No apparent anesthesia complications

## 2012-12-29 NOTE — Op Note (Signed)
Urology Operative Report  Date of Procedure: 12/29/12  Surgeon: Natalia Leatherwood, MD Assistant:  None  Preoperative Diagnosis: Left nephrolithiasis Postoperative Diagnosis:  Same  Procedure(s): Cystoscopy Left ureteroscopy Left ureter stent placement Fluoroscopy with interpretation.  Estimated blood loss: Minimal  Specimen: None  Drains: None  Complications:  Left mid-ureter mucosal disruption.  Findings: Left mid-ureter mucosal disruption. Possible left proximal ureter stone on fluoroscopy. Left ureteropelvic junction stone.  History of present illness: 52 year old male presents today for left sided nephrolithiasis. This is been accompanied by left flank pain. He has a known stone in his left lower pole and a stone in his left upper pole. Is believed that he began passing 1 the stones as ultrasound during a recent clinic visit revealed hydronephrosis and only a calcification in the left lower pole. He presents today for ureteroscopy for treatment of the stone.   Procedure in detail: After informed consent was obtained, the patient was taken to the operating room. They were placed in the supine position. SCDs were turned on and in place. IV antibiotics were infused, and general anesthesia was induced. A timeout was performed in which the correct patient, surgical site, and procedure were identified and agreed upon by the team. A belladonna and opium suppository was placed into the rectum.  The patient was placed in a dorsolithotomy position, making sure to pad all pertinent neurovascular pressure points. The genitals were prepped and draped in the usual sterile fashion.  A rigid cystoscope was advanced through the urethra and into the bladder. The bladder was drained. The bladder was fully distended and evaluated in a systematic fashion to visualize the entire surface of the bladder. This was negative for bladder tumors. Attention was turned left ureter orifice. Fluoroscopy of  the estimated tract of the ureter was performed which revealed a possible small stone fragment in the left mid ureter. There was also a large stone at the left ureter pelvic junction. A sensor wire was placed up the left ureter and left renal pelvis on fluoroscopy. This was secured as the safety wire. A second sensor-tip wire was placed up the left ureter into the left renal pelvis and used as a working wire. A 12-14 ureter access sheath was placed by placing first operator and the distal ureter and then the entire access sheath into the distal ureter. The obturator and wire were removed. I elected to only placed access sheath and the distal ureter as it did not want to place the access sheath pass a stone and caused injury to the ureter. He was noted to have a tortuous ureter from the inside which was difficult to navigate. During navigation in the mid ureter I had yet to visualize any stones when I noticed the mucosal disruption which was likely caused by navigation with the flexible digital ureter scope. Because of this disruption I was concerned that further manipulation of this ureter could result in a more serious injury to the ureter and I elected to place a stent. The ureter scope and access sheath were removed and the entire internal portion of the ureter distal to this area was visualized. There were no stones or other mucosal disruption.   The safety wire was then loaded through a cystoscope and a 6 x 24 double-J stent without the tethering string was deployed with a good curl in the left renal pelvis and beyond the left UPJ stone. There was a good curl in the bladder.  This completed the procedure. I placed 10 cc of  lidocaine jelly into the urethra. He was placed in a supine position, anesthesia was reversed, and he was taken to the [facility] in stable condition.  All counts were correct at the end of the case.  He will be scheduled for interval ureteroscopy on the left to take care of the stones once  his mucosal disruption has had a chance to heal.

## 2012-12-30 ENCOUNTER — Encounter (HOSPITAL_BASED_OUTPATIENT_CLINIC_OR_DEPARTMENT_OTHER): Payer: Self-pay | Admitting: Urology

## 2012-12-30 NOTE — Anesthesia Postprocedure Evaluation (Signed)
  Anesthesia Post-op Note  Patient: Kevin Kirby  Procedure(s) Performed: Procedure(s) (LRB): CYSTOSCOPY WITH RETROGRADE PYELOGRAM, URETEROSCOPY AND STENT PLACEMENT (Left)  Patient Location: PACU  Anesthesia Type: General  Level of Consciousness: awake and alert   Airway and Oxygen Therapy: Patient Spontanous Breathing  Post-op Pain: mild  Post-op Assessment: Post-op Vital signs reviewed, Patient's Cardiovascular Status Stable, Respiratory Function Stable, Patent Airway and No signs of Nausea or vomiting  Last Vitals:  Filed Vitals:   12/29/12 1652  BP: 150/89  Pulse: 58  Temp: 36.2 C  Resp:     Post-op Vital Signs: stable   Complications: No apparent anesthesia complications

## 2012-12-30 NOTE — Progress Notes (Signed)
Patient angry about complications from surgery. Did not ask all questions because Kevin Kirby is so upset. Patient worried & complained that a student was in the OR room, he signed a release & complained that in the OR they were probably too busy entertaining the student.

## 2012-12-31 LAB — POCT HEMOGLOBIN-HEMACUE: Hemoglobin: 14.1 g/dL (ref 13.0–17.0)

## 2013-02-14 ENCOUNTER — Other Ambulatory Visit: Payer: Self-pay | Admitting: Urology

## 2013-02-14 DIAGNOSIS — N200 Calculus of kidney: Secondary | ICD-10-CM

## 2013-03-29 ENCOUNTER — Ambulatory Visit
Admission: RE | Admit: 2013-03-29 | Discharge: 2013-03-29 | Disposition: A | Discharge status: Home / Self Care | Payer: BC Managed Care – PPO | Source: Ambulatory Visit | Attending: Urology | Admitting: Urology

## 2013-03-29 DIAGNOSIS — N200 Calculus of kidney: Secondary | ICD-10-CM

## 2014-05-28 IMAGING — US US RENAL
1 series · 14 of 25 positions shown · non-contrast
Comparison: None.

CLINICAL DATA: Left flank pain, lithotripsy, kidney stones

RENAL/URINARY TRACT ULTRASOUND COMPLETE

[Series 1: us renal · 0.30mm/px · 14 of 41 slices shown]
[im 1/41]
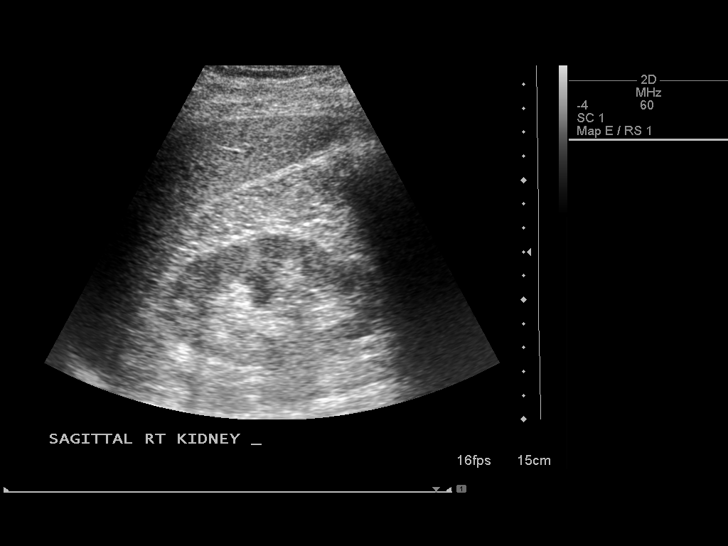
[im 4/41]
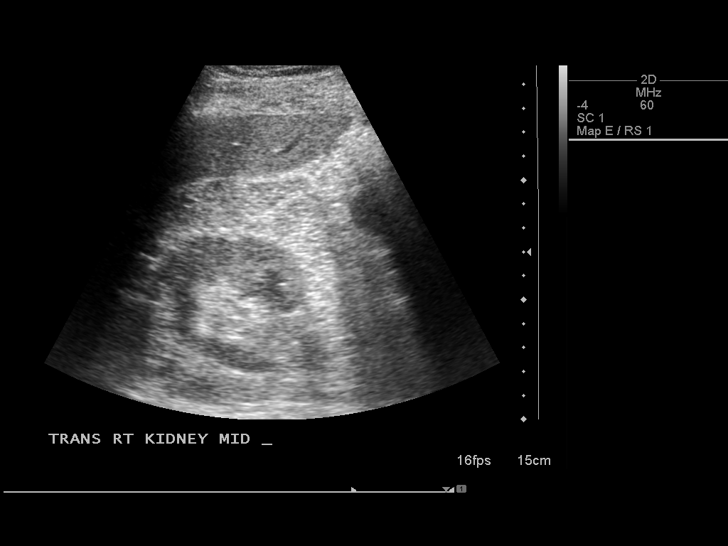
[im 7/41]
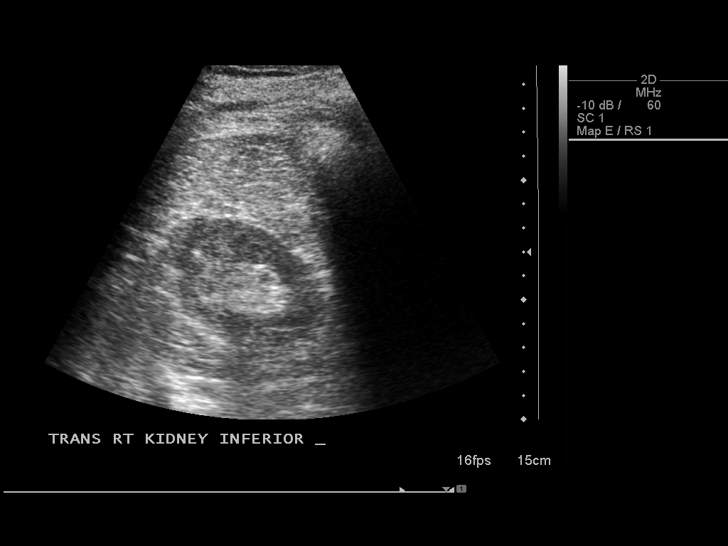
[im 11/41]
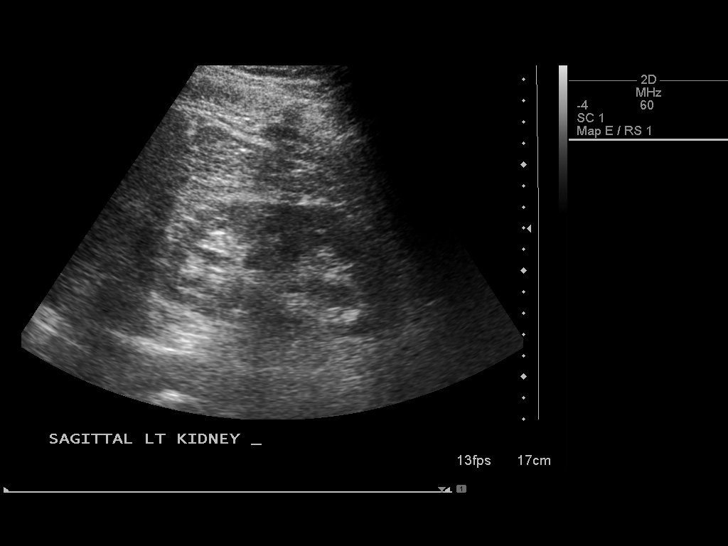
[im 14/41]
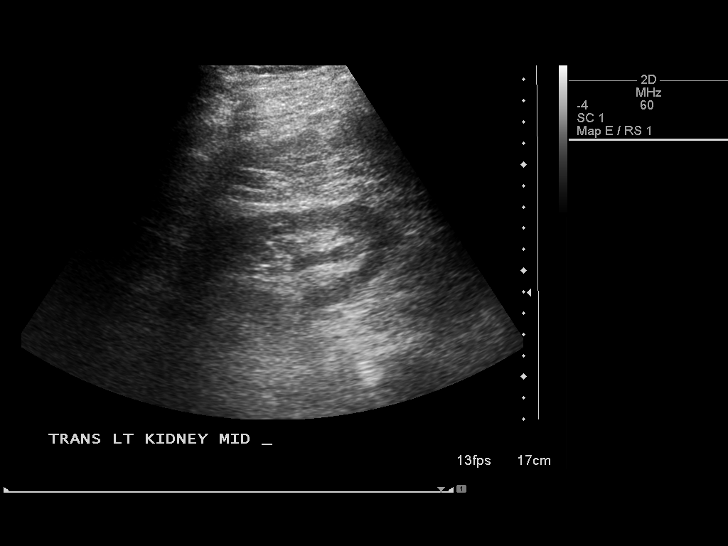
[im 16/41]
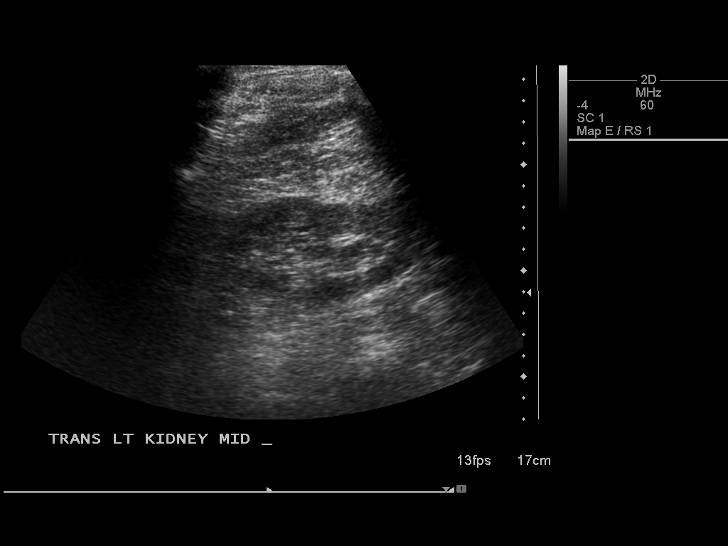
[im 19/41]
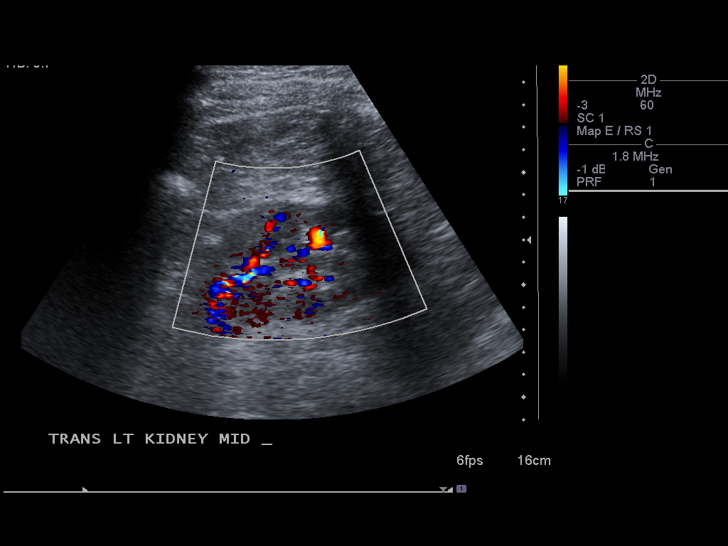
[im 22/41]
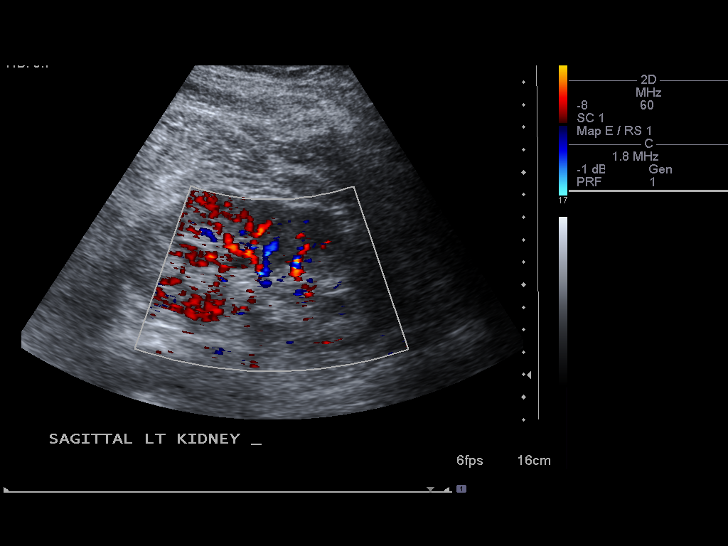
[im 26/41]
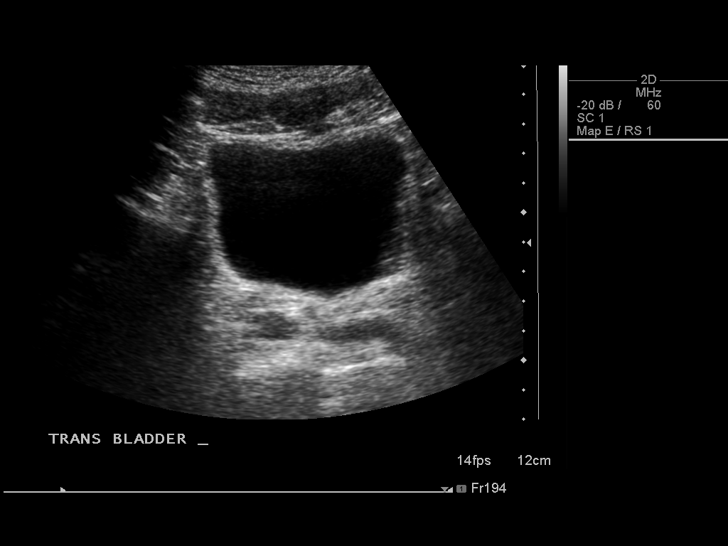
[im 27/41]
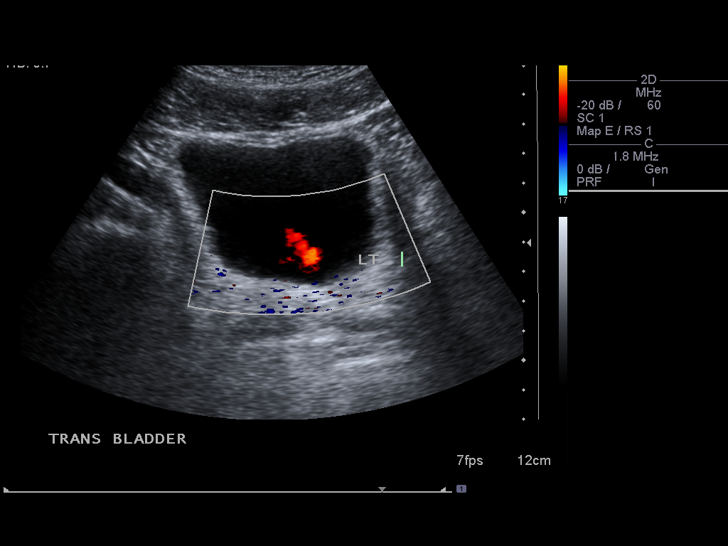
[im 31/41]
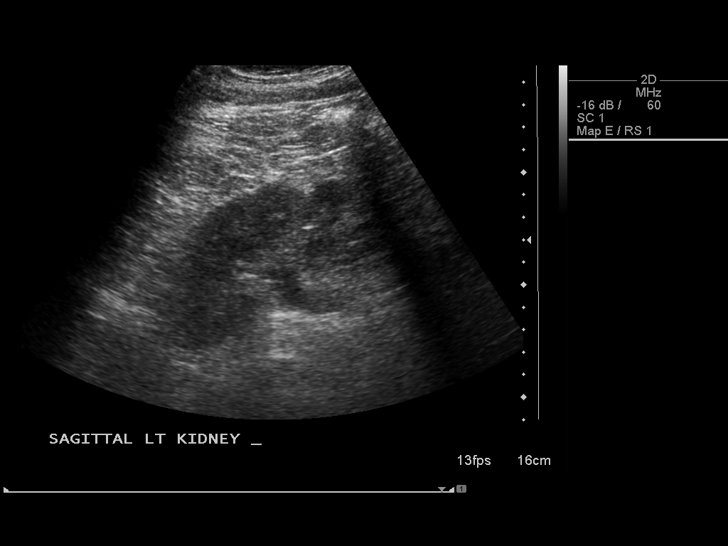
[im 34/41]
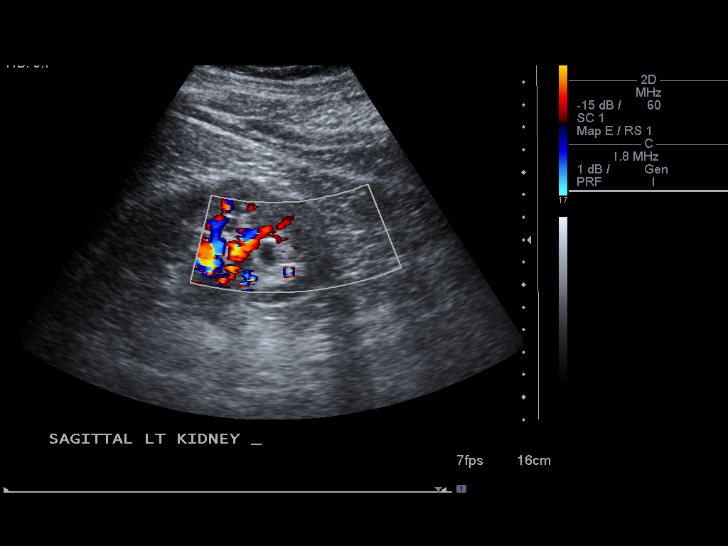
[im 37/41]
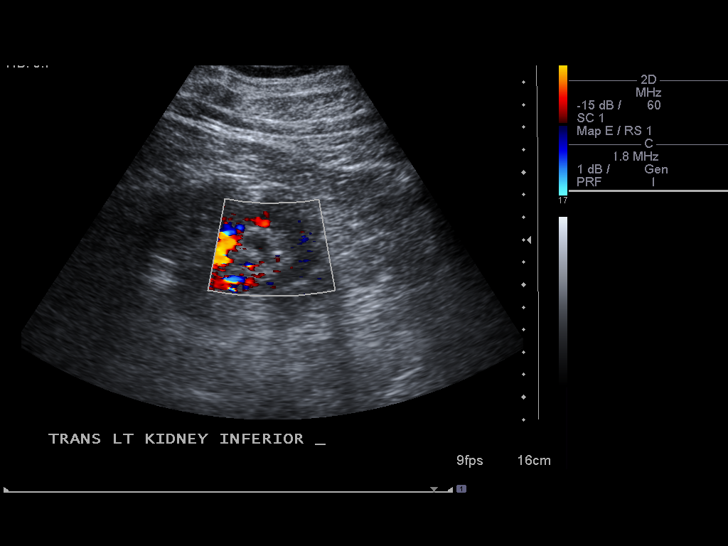
[im 41/41]
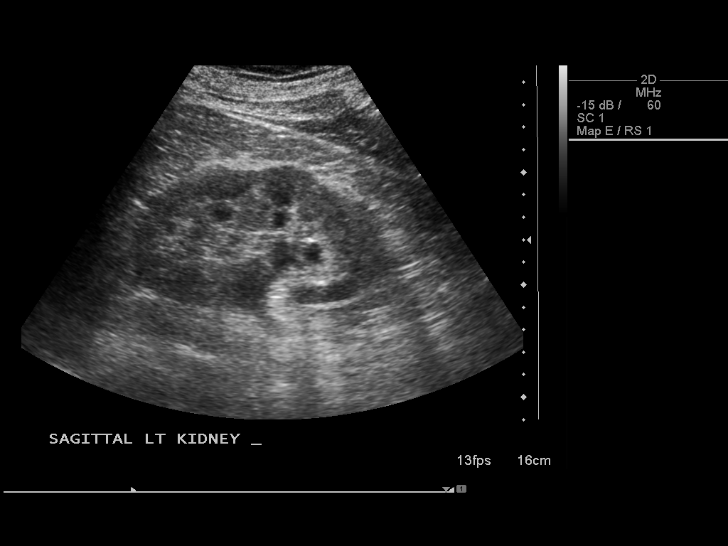

[14 of 25 positions shown; findings below may reference images not displayed]

FINDINGS: Right Kidney:  Measures 11.5 cm.  No mass or hydronephrosis.

Left Kidney:  Measures 12.4 cm.  7 mm lower pole calculus.  Mild
left hydronephrosis.

Bladder:  Within normal limits.  Bilateral bladder jets visualized.
IMPRESSION: 7 mm left lower pole renal calculus.  Mild left hydronephrosis.

Bilateral bladder jets visualized.

## 2014-09-13 IMAGING — US US RENAL
1 series · 14 of 25 positions shown · non-contrast
Comparison: Ultrasound of the kidneys of 12/11/2012

CLINICAL DATA: History of kidney stones and lithotripsy

EXAM:
RENAL/URINARY TRACT ULTRASOUND COMPLETE

[Series 1: us renal · 0.30mm/px · 14 of 36 slices shown]
[im 1/36]
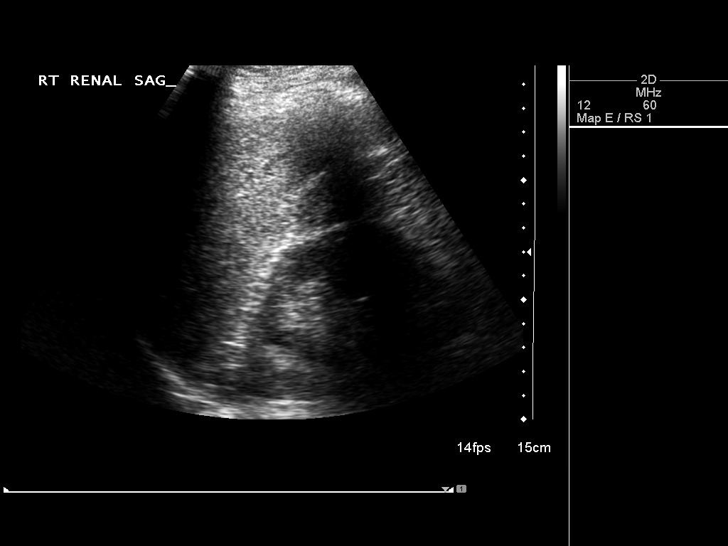
[im 3/36]
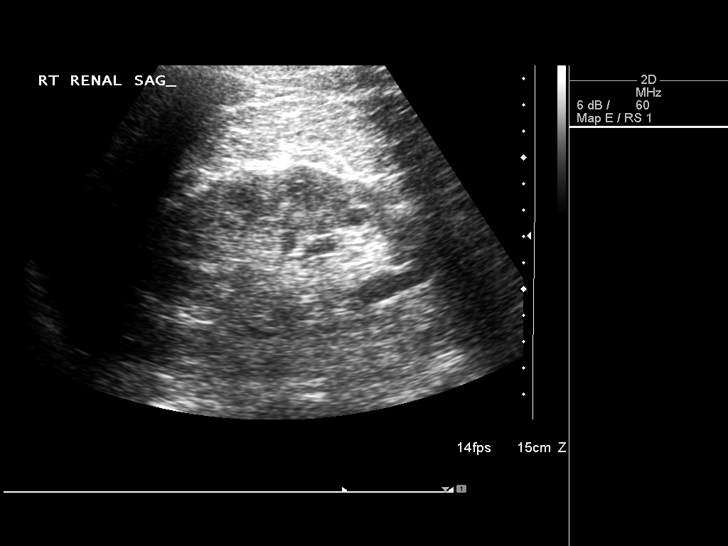
[im 6/36]
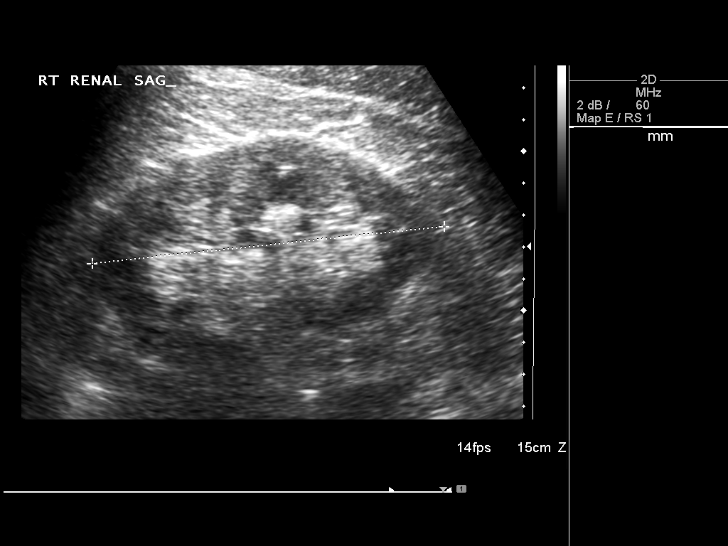
[im 9/36]
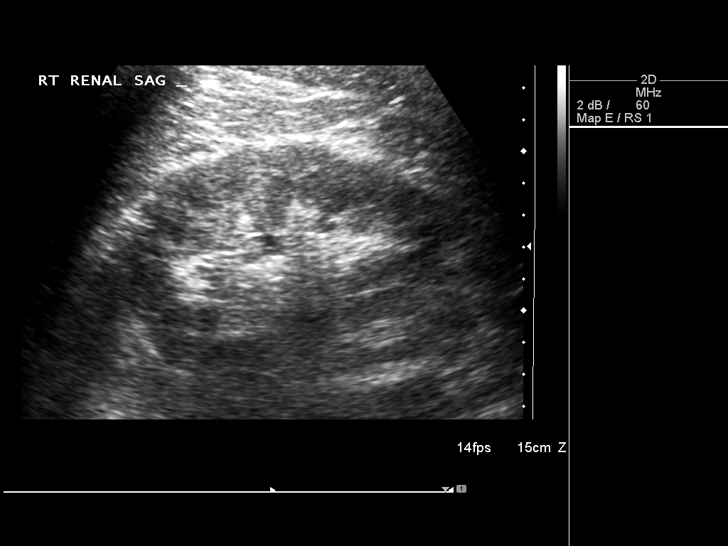
[im 12/36]
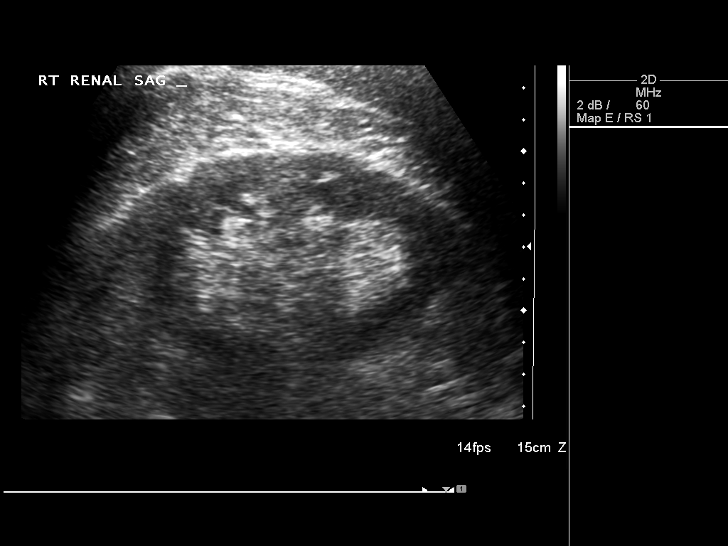
[im 14/36]
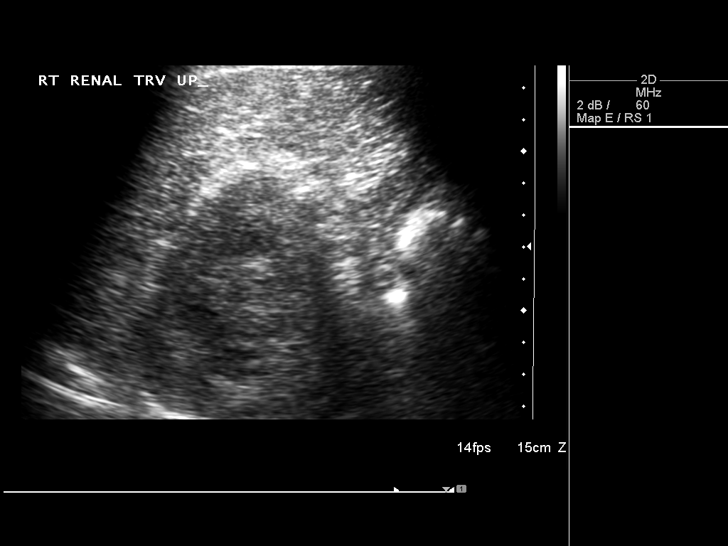
[im 17/36]
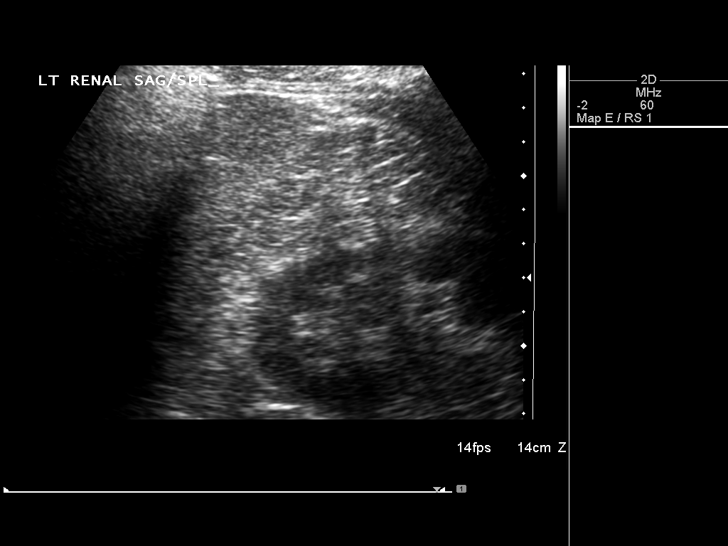
[im 19/36]
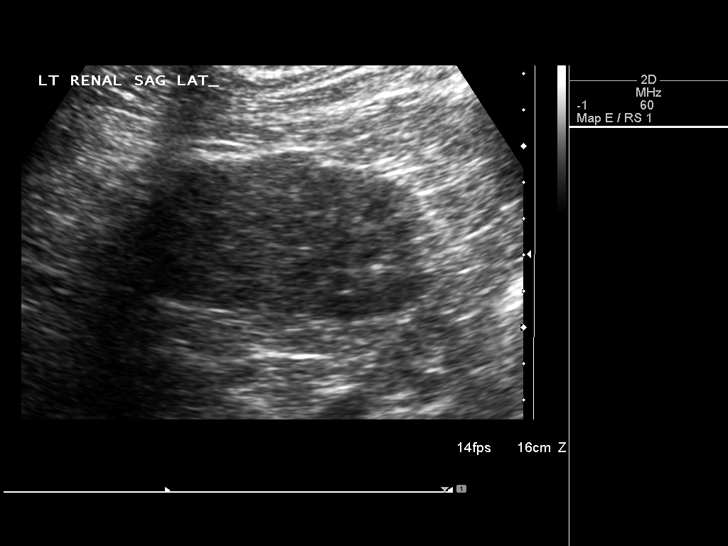
[im 22/36]
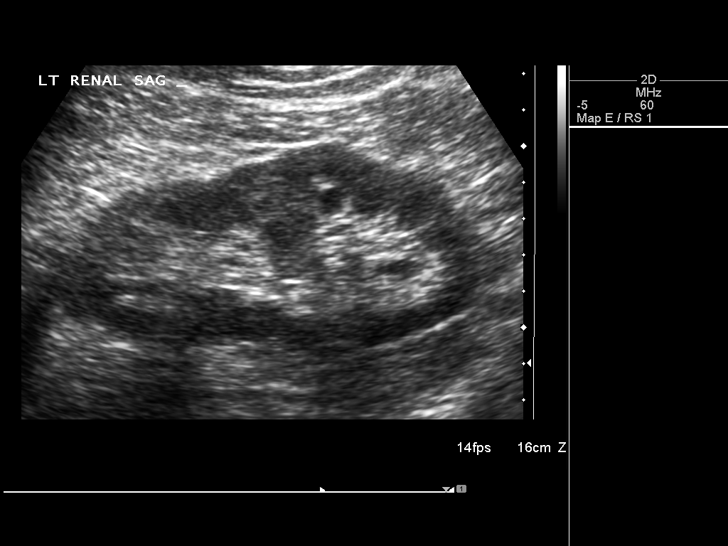
[im 24/36]
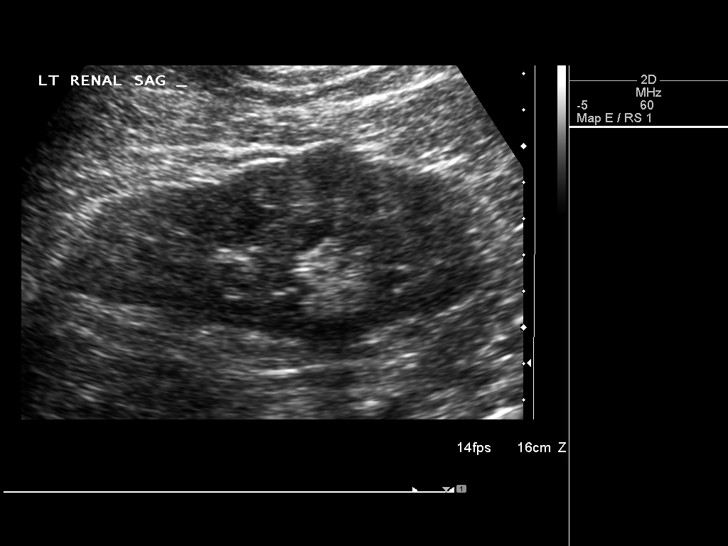
[im 27/36]
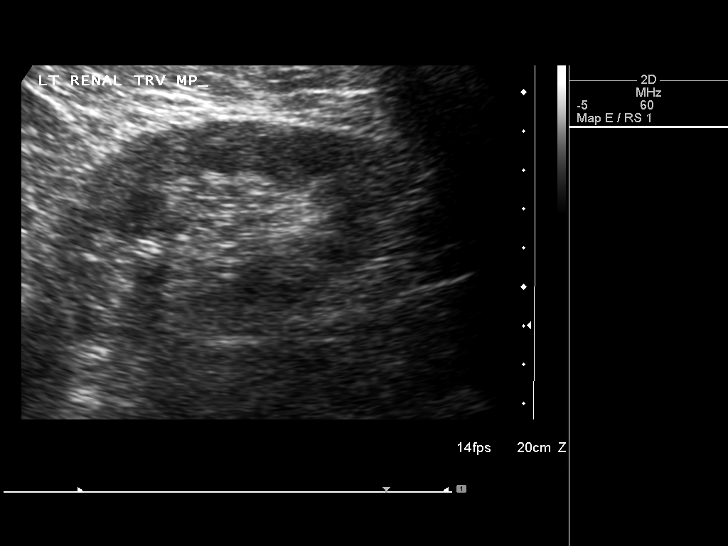
[im 30/36]
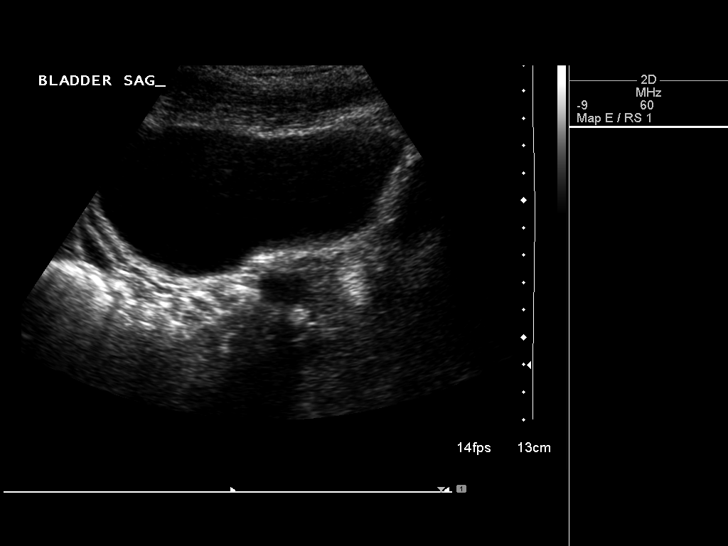
[im 33/36]
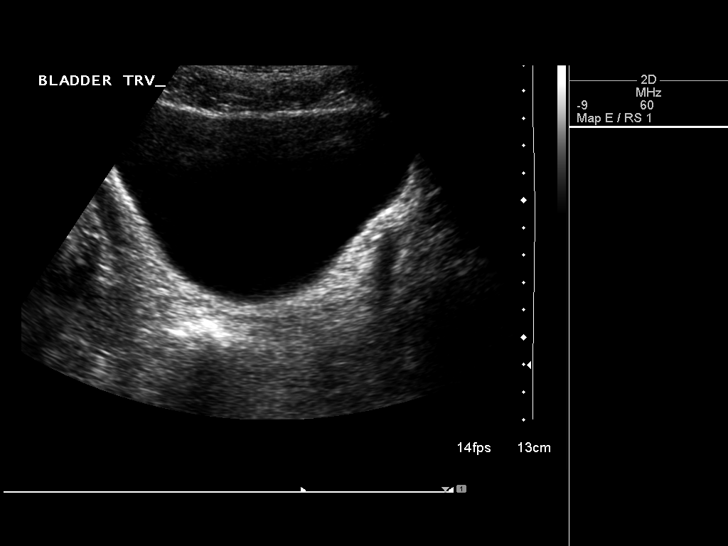
[im 36/36]
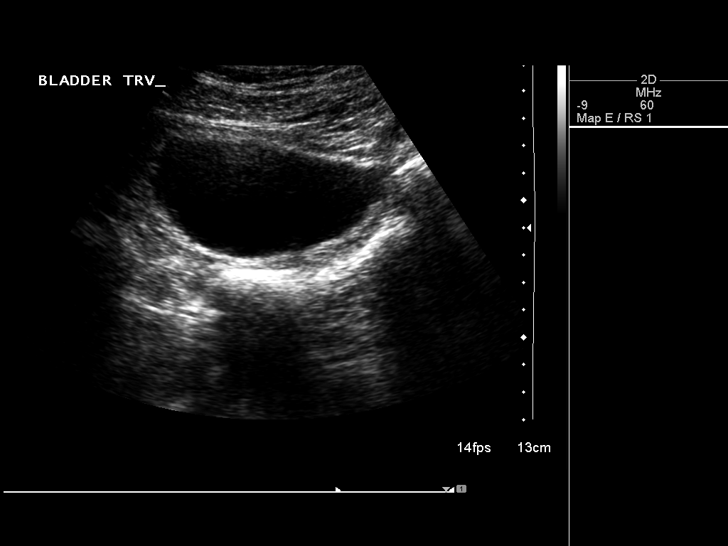

[14 of 25 positions shown; findings below may reference images not displayed]

FINDINGS: Right Kidney

Length: The right kidney measures 11.1 cm sagittally.. No
hydronephrosis is seen and no calculi are evident.

Left Kidney

Length: The left kidney measures 12.2 cm.. No hydronephrosis is seen
and no echogenic foci are noted.

Bladder

Urinary bladder is unremarkable
IMPRESSION: No hydronephrosis. No renal calculi are evident.

## 2016-07-30 DIAGNOSIS — H5203 Hypermetropia, bilateral: Secondary | ICD-10-CM | POA: Diagnosis not present

## 2016-07-30 DIAGNOSIS — H524 Presbyopia: Secondary | ICD-10-CM | POA: Diagnosis not present

## 2016-07-30 DIAGNOSIS — H52223 Regular astigmatism, bilateral: Secondary | ICD-10-CM | POA: Diagnosis not present

## 2016-12-26 ENCOUNTER — Telehealth: Payer: Self-pay | Admitting: Family Medicine

## 2016-12-26 ENCOUNTER — Encounter (HOSPITAL_BASED_OUTPATIENT_CLINIC_OR_DEPARTMENT_OTHER): Payer: Self-pay | Admitting: Emergency Medicine

## 2016-12-26 ENCOUNTER — Emergency Department (HOSPITAL_BASED_OUTPATIENT_CLINIC_OR_DEPARTMENT_OTHER)
Admission: EM | Admit: 2016-12-26 | Discharge: 2016-12-26 | Disposition: A | Discharge status: Home / Self Care | Payer: 59 | Attending: Emergency Medicine | Admitting: Emergency Medicine

## 2016-12-26 DIAGNOSIS — R5383 Other fatigue: Secondary | ICD-10-CM | POA: Diagnosis present

## 2016-12-26 DIAGNOSIS — F419 Anxiety disorder, unspecified: Secondary | ICD-10-CM | POA: Diagnosis not present

## 2016-12-26 DIAGNOSIS — Z79899 Other long term (current) drug therapy: Secondary | ICD-10-CM | POA: Diagnosis not present

## 2016-12-26 DIAGNOSIS — R002 Palpitations: Secondary | ICD-10-CM | POA: Diagnosis not present

## 2016-12-26 LAB — BASIC METABOLIC PANEL
ANION GAP: 9 (ref 5–15)
BUN: 16 mg/dL (ref 6–20)
CALCIUM: 9.1 mg/dL (ref 8.9–10.3)
CO2: 25 mmol/L (ref 22–32)
Chloride: 103 mmol/L (ref 101–111)
Creatinine, Ser: 0.99 mg/dL (ref 0.61–1.24)
Glucose, Bld: 76 mg/dL (ref 65–99)
Potassium: 4.4 mmol/L (ref 3.5–5.1)
Sodium: 137 mmol/L (ref 135–145)

## 2016-12-26 LAB — CBC
HCT: 44.9 % (ref 39.0–52.0)
Hemoglobin: 15.5 g/dL (ref 13.0–17.0)
MCH: 31.5 pg (ref 26.0–34.0)
MCHC: 34.5 g/dL (ref 30.0–36.0)
MCV: 91.3 fL (ref 78.0–100.0)
Platelets: 219 10*3/uL (ref 150–400)
RBC: 4.92 MIL/uL (ref 4.22–5.81)
RDW: 13.2 % (ref 11.5–15.5)
WBC: 4.1 10*3/uL (ref 4.0–10.5)

## 2016-12-26 LAB — TROPONIN I: Troponin I: <0.03 ng/mL (ref <0.03)

## 2016-12-26 MED ORDER — HYDROXYZINE HCL 25 MG PO TABS: 25.0000 mg | ORAL_TABLET | Freq: Four times a day (QID) | ORAL | 0 refills | Status: DC

## 2016-12-26 MED FILL — hydrOXYzine HCL 25 MG TABS: 25 | 7 days supply | Qty: 30 | Fill #0

## 2016-12-26 NOTE — Telephone Encounter (Signed)
[facility] is the appropriate place but for future reference I am willing to see her son as a new patient

## 2016-12-26 NOTE — ED Notes (Signed)
Pt on monitor 

## 2016-12-26 NOTE — ED Notes (Signed)
Pt refused wheelchair.  

## 2016-12-26 NOTE — Telephone Encounter (Signed)
Caller name: Andriy Sherk Relationship to patient: pt mother  Can be reached: patient cell # [phone] - call him to let him know if approved or not  Reason for call: pt mother is current pt with Dr. Charlett Blake stating that Dr. Charlett Blake agreed to see her son in past conversation. She is wanting to set up appt for new pt. Please advise on new pt?  She stated needing appt today for pt having chest pain. I advised pt is not current with our practice and with his symptoms he should go to the ER. Tiny agreed with recommendation and will notify her son/the pt to go to ER.

## 2016-12-26 NOTE — Discharge Instructions (Signed)
Please read and follow all provided instructions.  Your diagnoses today include:  1. Palpitations   2. Anxiety    Tests performed today include:  An EKG of your heart  Cardiac enzymes - a blood test for heart muscle damage  Blood counts and electrolytes  Vital signs. See below for your results today.   Medications prescribed:   Hydroxyzine - antihistamine  You can find this medication over-the-counter.   This medication will make you drowsy. DO NOT drive or perform any activities that require you to be awake and alert if taking this.  Take any prescribed medications only as directed.  Follow-up instructions: Please follow-up with your primary care provider as soon as you can for further evaluation of your symptoms.   Return instructions:  SEEK IMMEDIATE MEDICAL ATTENTION IF:  You have severe chest pain, especially if the pain is crushing or pressure-like and spreads to the arms, back, neck, or jaw, or if you have sweating, nausea (feeling sick to your stomach), or shortness of breath. THIS IS AN EMERGENCY. Don't wait to see if the pain will go away. Get medical help at once. Call 911 or [phone] (operator). DO NOT drive yourself to the hospital.   Your chest pain gets worse and does not go away with rest.   You have an attack of chest pain lasting longer than usual, despite rest and treatment with the medications your caregiver has prescribed.   You wake from sleep with chest pain or shortness of breath.  You feel dizzy or faint.  You have chest pain not typical of your usual pain for which you originally saw your caregiver.   You have any other emergent concerns regarding your health.  Additional Information: Chest pain comes from many different causes. Your caregiver has diagnosed you as having chest pain that is not specific for one problem, but does not require admission.  You are at low risk for an acute heart condition or other serious illness.   Your vital signs today  were: BP 131/86    Pulse 67    Temp 98.6 F (37 C) (Oral)    Resp (!) 21    Ht 5\' 10"  (1.778 m)    Wt 90.7 kg (200 lb)    SpO2 98%    BMI 28.70 kg/m  If your blood pressure (BP) was elevated above 135/85 this visit, please have this repeated by your doctor within one month. --------------

## 2016-12-26 NOTE — ED Notes (Signed)
ED Provider at bedside. 

## 2016-12-26 NOTE — ED Notes (Signed)
Patient denies pain and is resting comfortably.  

## 2016-12-26 NOTE — ED Provider Notes (Signed)
[facility] Provider Note   CSN: [id number] Arrival date & time: 12/26/16  1025     History   Chief Complaint Chief Complaint  Patient presents with  . Fatigue    HPI Kevin Kirby is a 56 y.o. male.  Patient presents with complaint of anxiety, palpitations. Patient states that he has had anxiety for a long time and has been on medications for this in the past. He also notes that he had a normal cardiac catheterization approximately 6-7 years ago. Patient reports having a panic attack this morning. Symptoms began acutely. He had associated fatigue which was very worrisome to him. He was worried that "my heart is about to stop". No associated chest pain, tightness, pressure or shortness of breath. Patient reports being active without any the symptoms. In the past, he has been able to remove himself from stressors and control his symptoms, however this has been more difficult recently. Family history of heart failure, patient denies swelling or orthopnea. The onset of this condition was acute. The course is improved.  Aggravating factors: none. Alleviating factors: none.   Patient also concerned about several skin spots on his back.        Past Medical History:  Diagnosis Date  . Anxiety    HX PANIC ATTACKS  . History of kidney stones   . Nephrolithiasis    LEFT     Patient Active Problem List   Diagnosis Date Noted  . Anxiety 09/10/2010    Class: Chronic  . Nephrolithiasis   . Atypical angina ([place])   . OTH NONSPECIFIC ABNORM CV SYSTEM FUNCTION STUDY 03/05/2009  . CHEST PAIN 03/02/2009  . HYPERLIPIDEMIA-MIXED 02/27/2009    Past Surgical History:  Procedure Laterality Date  . CARDIAC CATHETERIZATION  03-11-2009  [facility]   NORMAL CORONARY ARTERIES/  NORMAL LVF/ EF 65%  . CLOSED REDUCTION LEFT ARM FX  AGE 15  . CYSTOSCOPY WITH RETROGRADE PYELOGRAM, URETEROSCOPY AND STENT PLACEMENT Left 12/29/2012   Procedure: CYSTOSCOPY WITH RETROGRADE PYELOGRAM,  URETEROSCOPY AND STENT PLACEMENT;  Surgeon: Molli Hazard, MD;  Location: [facility];  Service: Urology;  Laterality: Left;  . EXTRACORPOREAL SHOCK WAVE LITHOTRIPSY    . URETEROLITHOTOMY         Home Medications    Prior to Admission medications   Medication Sig Start Date End Date Taking? Authorizing Provider  hyoscyamine (LEVSIN, ANASPAZ) 0.125 MG tablet Take 1 tablet (0.125 mg total) by mouth every 4 (four) hours as needed for cramping (bladder spasms). 12/29/12  Yes Rolan Bucco, MD  ondansetron (ZOFRAN) 8 MG tablet Take 1 tablet (8 mg total) by mouth every 8 (eight) hours as needed for nausea. 12/11/12  Yes Orlie Dakin, MD  aspirin 325 MG EC tablet Take 325 mg by mouth as needed.     [provider]    Family History Family History  Problem Relation Age of Onset  . Osteoarthritis Mother        DOB 7  . Deep vein thrombosis Mother   . Angina Father        decesed @ 5  . Aneurysm Sister        deceased@ 58  . Healthy Sister     Social History Social History  Substance Use Topics  . Smoking status: Never Smoker  . Smokeless tobacco: Never Used  . Alcohol use 0.5 oz/week    1 drink(s) per week     Allergies   Patient has no known allergies.  Review of Systems Review of Systems  Constitutional: Negative for diaphoresis and fever.  Eyes: Negative for redness.  Respiratory: Negative for cough and shortness of breath.   Cardiovascular: Positive for palpitations. Negative for chest pain and leg swelling.  Gastrointestinal: Negative for abdominal pain, nausea and vomiting.  Genitourinary: Negative for dysuria.  Musculoskeletal: Negative for back pain and neck pain.  Skin: Positive for color change. Negative for rash.  Neurological: Negative for syncope and light-headedness.  Psychiatric/Behavioral: The patient is nervous/anxious.      Physical Exam Updated Vital Signs BP (!) 148/91 (BP Location: Left Arm)   Pulse 62    Temp 98.6 F (37 C) (Oral)   Resp 16   Ht 5\' 10"  (1.778 m)   Wt 90.7 kg (200 lb)   SpO2 100%   BMI 28.70 kg/m   Physical Exam  Constitutional: He appears well-developed and well-nourished.  HENT:  Head: Normocephalic and atraumatic.  Mouth/Throat: Mucous membranes are normal. Mucous membranes are not dry.  Eyes: Conjunctivae are normal. Right eye exhibits no discharge. Left eye exhibits no discharge.  Neck: Trachea normal and normal range of motion. Neck supple. Normal carotid pulses and no JVD present. No muscular tenderness present. Carotid bruit is not present. No tracheal deviation present.  Cardiovascular: Normal rate, regular rhythm, S1 normal, S2 normal, normal heart sounds and intact distal pulses.  Exam reveals no distant heart sounds and no decreased pulses.   No murmur heard. Pulmonary/Chest: Effort normal and breath sounds normal. No respiratory distress. He has no wheezes. He exhibits no tenderness.  Abdominal: Soft. Normal aorta and bowel sounds are normal. There is no tenderness. There is no rebound and no guarding.  Musculoskeletal: He exhibits no edema.  Neurological: He is alert.  Skin: Skin is warm and dry. He is not diaphoretic. No cyanosis. No pallor.  Several benign appearing (SK) on back.   Psychiatric: His mood appears anxious.  Nursing note and vitals reviewed.    ED Treatments / Results  Labs (all labs ordered are listed, but only abnormal results are displayed) Labs Reviewed  CBC  BASIC METABOLIC PANEL  TROPONIN I   ED ECG REPORT   Date: 12/26/2016  Rate: 64  Rhythm: normal sinus rhythm  QRS Axis: normal  Intervals: normal  ST/T Wave abnormalities: normal  Conduction Disutrbances:none  Narrative Interpretation:   Old EKG Reviewed: unchanged from 2010  I have personally reviewed the EKG tracing and agree with the computerized printout as noted.  Procedures Procedures (including critical care time)  Medications Ordered in  ED Medications - No data to display   Initial Impression / Assessment and Plan / ED Course  I have reviewed the triage vital signs and the nursing notes.  Pertinent labs & imaging results that were available during my care of the patient were reviewed by me and considered in my medical decision making (see chart for details).     Patient seen and examined. Suspect anxiety is root cause of symptoms -- will check EKG and troponin x 1 to r/o more concerning etiology given worsening sx over past week.   Vital signs reviewed and are as follows: BP (!) 148/91 (BP Location: Left Arm)   Pulse 62   Temp 98.6 F (37 C) (Oral)   Resp 16   Ht 5\' 10"  (1.778 m)   Wt 90.7 kg (200 lb)   SpO2 100%   BMI 28.70 kg/m   Patient updated on results. Will give PCP referrals, home with hydroxyzine.  Patient was counseled to return with severe chest pain, especially if the pain is crushing or pressure-like and spreads to the arms, back, neck, or jaw, or if they have sweating, nausea, or shortness of breath with the pain. They were encouraged to call 911 with these symptoms.   They were also told to return if their chest pain gets worse and does not go away with rest, they have an attack of chest pain lasting longer than usual despite rest and treatment with the medications their caregiver has prescribed, if they wake from sleep with chest pain or shortness of breath, if they feel dizzy or faint, if they have chest pain not typical of their usual pain, or if they have any other emergent concerns regarding their health.  The patient verbalized understanding and agreed.      Final Clinical Impressions(s) / ED Diagnoses   Final diagnoses:  Palpitations  Anxiety   Patient with palpitations, probable anxiety component. Workup today is reassuring for cardiac etiology. No abnormalities noted on EKG. Encouraged PCP follow-up.  New Prescriptions Discharge Medication List as of 12/26/2016  1:08 PM        Carlisle Cater, PA-C 12/26/16 1449    Tanna Furry, MD 12/28/16 [phone]

## 2016-12-30 NOTE — Telephone Encounter (Signed)
Called to schedule pt, VM full, unable to leave msg to schedule new pt appt with Dr. Charlett Blake as approved

## 2017-01-06 NOTE — Telephone Encounter (Signed)
Scheduled for 03/05/17 by Jonelle Sidle

## 2017-03-05 ENCOUNTER — Encounter (INDEPENDENT_AMBULATORY_CARE_PROVIDER_SITE_OTHER): Payer: Self-pay

## 2017-03-05 ENCOUNTER — Ambulatory Visit (INDEPENDENT_AMBULATORY_CARE_PROVIDER_SITE_OTHER): Payer: 59 | Admitting: Family Medicine

## 2017-03-05 ENCOUNTER — Encounter: Payer: Self-pay | Admitting: Family Medicine

## 2017-03-05 VITALS — BP 120/83 | HR 71 | Temp 98.2°F | Resp 18 | Ht 70.0 in | Wt 209.4 lb

## 2017-03-05 DIAGNOSIS — E039 Hypothyroidism, unspecified: Secondary | ICD-10-CM | POA: Diagnosis not present

## 2017-03-05 DIAGNOSIS — H5500 Unspecified nystagmus: Secondary | ICD-10-CM | POA: Diagnosis not present

## 2017-03-05 DIAGNOSIS — H269 Unspecified cataract: Secondary | ICD-10-CM

## 2017-03-05 DIAGNOSIS — R946 Abnormal results of thyroid function studies: Secondary | ICD-10-CM

## 2017-03-05 DIAGNOSIS — Z79899 Other long term (current) drug therapy: Secondary | ICD-10-CM | POA: Diagnosis not present

## 2017-03-05 DIAGNOSIS — Z8619 Personal history of other infectious and parasitic diseases: Secondary | ICD-10-CM

## 2017-03-05 DIAGNOSIS — Z1211 Encounter for screening for malignant neoplasm of colon: Secondary | ICD-10-CM

## 2017-03-05 DIAGNOSIS — F419 Anxiety disorder, unspecified: Secondary | ICD-10-CM

## 2017-03-05 DIAGNOSIS — E785 Hyperlipidemia, unspecified: Secondary | ICD-10-CM

## 2017-03-05 DIAGNOSIS — Z299 Encounter for prophylactic measures, unspecified: Secondary | ICD-10-CM

## 2017-03-05 DIAGNOSIS — R7989 Other specified abnormal findings of blood chemistry: Secondary | ICD-10-CM

## 2017-03-05 HISTORY — DX: Unspecified nystagmus: H55.00

## 2017-03-05 HISTORY — DX: Unspecified cataract: H26.9

## 2017-03-05 MED ORDER — ALPRAZOLAM 0.25 MG PO TABS: 0.2500 mg | ORAL_TABLET | Freq: Two times a day (BID) | ORAL | 2 refills | Status: DC | PRN

## 2017-03-05 NOTE — Patient Instructions (Addendum)
Shingrix is the new shingles shot 2 shots over 2-6 months. Check with insurance about payment Preventive Care 40-64 Years, Male Preventive care refers to lifestyle choices and visits with your health care provider that can promote health and wellness. What does preventive care include?  A yearly physical exam. This is also called an annual well check.  Dental exams once or twice a year.  Routine eye exams. Ask your health care provider how often you should have your eyes checked.  Personal lifestyle choices, including: ? Daily care of your teeth and gums. ? Regular physical activity. ? Eating a healthy diet. ? Avoiding tobacco and drug use. ? Limiting alcohol use. ? Practicing safe sex. ? Taking low-dose aspirin every day starting at age 81. What happens during an annual well check? The services and screenings done by your health care provider during your annual well check will depend on your age, overall health, lifestyle risk factors, and family history of disease. Counseling Your health care provider may ask you questions about your:  Alcohol use.  Tobacco use.  Drug use.  Emotional well-being.  Home and relationship well-being.  Sexual activity.  Eating habits.  Work and work Statistician.  Screening You may have the following tests or measurements:  Height, weight, and BMI.  Blood pressure.  Lipid and cholesterol levels. These may be checked every 5 years, or more frequently if you are over 50 years old.  Skin check.  Lung cancer screening. You may have this screening every year starting at age 33 if you have a 30-pack-year history of smoking and currently smoke or have quit within the past 15 years.  Fecal occult blood test (FOBT) of the stool. You may have this test every year starting at age 46.  Flexible sigmoidoscopy or colonoscopy. You may have a sigmoidoscopy every 5 years or a colonoscopy every 10 years starting at age 35.  Prostate cancer  screening. Recommendations will vary depending on your family history and other risks.  Hepatitis C blood test.  Hepatitis B blood test.  Sexually transmitted disease (STD) testing.  Diabetes screening. This is done by checking your blood sugar (glucose) after you have not eaten for a while (fasting). You may have this done every 1-3 years.  Discuss your test results, treatment options, and if necessary, the need for more tests with your health care provider. Vaccines Your health care provider may recommend certain vaccines, such as:  Influenza vaccine. This is recommended every year.  Tetanus, diphtheria, and acellular pertussis (Tdap, Td) vaccine. You may need a Td booster every 10 years.  Varicella vaccine. You may need this if you have not been vaccinated.  Zoster vaccine. You may need this after age 57.  Measles, mumps, and rubella (MMR) vaccine. You may need at least one dose of MMR if you were born in 1957 or later. You may also need a second dose.  Pneumococcal 13-valent conjugate (PCV13) vaccine. You may need this if you have certain conditions and have not been vaccinated.  Pneumococcal polysaccharide (PPSV23) vaccine. You may need one or two doses if you smoke cigarettes or if you have certain conditions.  Meningococcal vaccine. You may need this if you have certain conditions.  Hepatitis A vaccine. You may need this if you have certain conditions or if you travel or work in places where you may be exposed to hepatitis A.  Hepatitis B vaccine. You may need this if you have certain conditions or if you travel or work in  places where you may be exposed to hepatitis B.  Haemophilus influenzae type b (Hib) vaccine. You may need this if you have certain risk factors.  Talk to your health care provider about which screenings and vaccines you need and how often you need them. This information is not intended to replace advice given to you by your health care provider. Make  sure you discuss any questions you have with your health care provider. Document Released: 06/22/2015 Document Revised: 02/13/2016 Document Reviewed: 03/27/2015 Elsevier Interactive Patient Education  2017 [employer].

## 2017-03-05 NOTE — Progress Notes (Signed)
Subjective:  I acted as a Education administrator for Dr. Charlett Blake. [place], [place]  Patient ID: Kevin Kirby, male    DOB: 1961/04/03, 56 y.o.   MRN: [id number]  No chief complaint on file.   HPI  Patient is in today for to establish care. He states he has had some anxiety issues. He acknowledges a history of panic attacks and is currently noting roughly 3 anxiety attacks weekly with racing thoughts, tremulousness, irritability, palpitations and difficulty concentrating. He has not seen a PMD for quite some time and has been managing on his own. No recent hospitalizations or febrile illness. 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 He also has a history of hyperlipidemia, kidney stones. Denies CP/SOB/HA/congestion/fevers/GI or GU c/o. Taking meds as prescribed  Patient Care Team: Mosie Lukes, MD as PCP - General (Family Medicine)   Past Medical History:  Diagnosis Date  . Anxiety    HX PANIC ATTACKS  . Asthma    pediatric and cold weather  . Cataract 03/05/2017  . Colon cancer screening 03/08/2017  . History of chicken pox   . History of kidney stones   . Hypothyroidism 03/08/2017  . Nephrolithiasis    LEFT   . Nystagmus 03/05/2017    Past Surgical History:  Procedure Laterality Date  . CARDIAC CATHETERIZATION  03-11-2009  DR [facility]   NORMAL CORONARY ARTERIES/  NORMAL LVF/ EF 65%  . CLOSED REDUCTION LEFT ARM FX  AGE 52  . CYSTOSCOPY WITH RETROGRADE PYELOGRAM, URETEROSCOPY AND STENT PLACEMENT Left 12/29/2012   Procedure: CYSTOSCOPY WITH RETROGRADE PYELOGRAM, URETEROSCOPY AND STENT PLACEMENT;  Surgeon: Molli Hazard, MD;  Location: [facility];  Service: Urology;  Laterality: Left;  . EXTRACORPOREAL SHOCK WAVE LITHOTRIPSY    . PAROTID GLAND TUMOR EXCISION    . URETEROLITHOTOMY     b/l    Family History  Problem Relation Age of Onset  . Osteoarthritis Mother        DOB 70  . Deep vein  thrombosis Mother   . Angina Father        decesed @ 74  . Heart disease Father   . Aneurysm Sister        deceased@ 58, brain  . Brain cancer Brother        ?tumor  . Healthy Sister   . Interstitial cystitis Daughter     Social History   Social History  . Marital status: Married    Spouse name: N/A  . Number of children: N/A  . Years of education: N/A   Occupational History  . Not on file.   Social History Main Topics  . Smoking status: Never Smoker  . Smokeless tobacco: Never Used  . Alcohol use 0.5 oz/week    1 Standard drinks or equivalent per week  . Drug use: Unknown  . Sexual activity: Yes   Other Topics Concern  . 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 No cigarette, less than 1 beer daily, wears seat belt    Outpatient Medications Prior to Visit  Medication Sig Dispense Refill  . aspirin 325 MG EC tablet Take 325 mg by mouth as needed.     . hydrOXYzine (ATARAX/VISTARIL) 25 MG tablet Take 1 tablet (25 mg total) by mouth every 6 (six) hours. 30 tablet 0  . hyoscyamine (LEVSIN,  ANASPAZ) 0.125 MG tablet Take 1 tablet (0.125 mg total) by mouth every 4 (four) hours as needed for cramping (bladder spasms). 40 tablet 4  . ondansetron (ZOFRAN) 8 MG tablet Take 1 tablet (8 mg total) by mouth every 8 (eight) hours as needed for nausea. 12 tablet 0   No facility-administered medications prior to visit.     No Known Allergies  Review of Systems  Constitutional: Positive for malaise/fatigue. Negative for fever.  HENT: Negative for congestion.   Eyes: Negative for blurred vision.  Respiratory: Negative for cough and shortness of breath.   Cardiovascular: Positive for palpitations. Negative for chest pain, orthopnea, leg swelling and PND.  Gastrointestinal: Negative for vomiting.  Musculoskeletal: Negative for back pain.  Skin: Negative for rash.  Neurological: Negative for  loss of consciousness and headaches.  Psychiatric/Behavioral: Positive for depression. The patient is nervous/anxious.        Objective:    Physical Exam  Constitutional: He is oriented to person, place, and time. He appears well-developed and well-nourished. No distress.  HENT:  Head: Normocephalic and atraumatic.  Eyes: Conjunctivae are normal.  Neck: Normal range of motion. No thyromegaly present.  Cardiovascular: Normal rate and regular rhythm.   Pulmonary/Chest: Effort normal and breath sounds normal. He has no wheezes.  Abdominal: Soft. Bowel sounds are normal. There is no tenderness.  Musculoskeletal: Normal range of motion. He exhibits no edema or deformity.  Neurological: He is alert and oriented to person, place, and time.  Skin: Skin is warm and dry. He is not diaphoretic.  Psychiatric: He has a normal mood and affect.    BP 120/83 (BP Location: Left Arm, Patient Position: Sitting, Cuff Size: Normal)   Pulse 71   Temp 98.2 F (36.8 C) (Oral)   Resp 18   Ht 5\' 10"  (1.778 m)   Wt 209 lb 6.4 oz (95 kg)   SpO2 99%   BMI 30.05 kg/m  Wt Readings from Last 3 Encounters:  03/05/17 209 lb 6.4 oz (95 kg)  12/26/16 200 lb (90.7 kg)  12/29/12 196 lb 5 oz (89 kg)   BP Readings from Last 3 Encounters:  03/05/17 120/83  12/26/16 131/86  12/29/12 (!) 150/89      There is no immunization history on file for this patient.  Health Maintenance  Topic Date Due  . Hepatitis C Screening  02-Aug-1960  . HIV Screening  08/12/1974  . TETANUS/TDAP  08/12/1978  . COLONOSCOPY  08/11/2009  . INFLUENZA VACCINE  Excluded    Lab Results  Component Value Date   WBC 5.9 03/05/2017   HGB 14.9 03/05/2017   HCT 45.2 03/05/2017   PLT 257.0 03/05/2017   GLUCOSE 77 03/05/2017   CHOL 225 (H) 03/05/2017   TRIG 116.0 03/05/2017   HDL 36.70 (L) 03/05/2017   LDLDIRECT 156.7 08/27/2010   LDLCALC 165 (H) 03/05/2017   ALT 9 03/05/2017   AST 16 03/05/2017   NA 138 03/05/2017   K 4.3  03/05/2017   CL 101 03/05/2017   CREATININE 0.94 03/05/2017   BUN 14 03/05/2017   CO2 30 03/05/2017   TSH 35.96 (H) 03/05/2017   PSA 0.75 03/05/2017   INR 1.1 03/12/2009    Lab Results  Component Value Date   TSH 35.96 (H) 03/05/2017   Lab Results  Component Value Date   WBC 5.9 03/05/2017   HGB 14.9 03/05/2017   HCT 45.2 03/05/2017   MCV 95.2 03/05/2017   PLT 257.0 03/05/2017   Lab Results  Component Value Date   NA 138 03/05/2017   K 4.3 03/05/2017   CO2 30 03/05/2017   GLUCOSE 77 03/05/2017   BUN 14 03/05/2017   CREATININE 0.94 03/05/2017   BILITOT 0.5 03/05/2017   ALKPHOS 60 03/05/2017   AST 16 03/05/2017   ALT 9 03/05/2017   PROT 6.8 03/05/2017   ALBUMIN 4.3 03/05/2017   CALCIUM 9.5 03/05/2017   ANIONGAP 9 12/26/2016   GFR 87.74 03/05/2017   Lab Results  Component Value Date   CHOL 225 (H) 03/05/2017   Lab Results  Component Value Date   HDL 36.70 (L) 03/05/2017   Lab Results  Component Value Date   LDLCALC 165 (H) 03/05/2017   Lab Results  Component Value Date   TRIG 116.0 03/05/2017   Lab Results  Component Value Date   CHOLHDL 6 03/05/2017   No results found for: HGBA1C       Assessment & Plan:   Problem List Items Addressed This Visit      Chronic   Anxiety    Notes significant stress with adult children, numerous rental properties and more. He has suffered from some panic attacks in past. Not daily. He declines daily med at this time and believes he will not use Alprazolam regularly. He is warned if he needs it frequently we will need to consider SSRI. Contract signed and UDS obtained      Relevant Medications   ALPRAZolam (XANAX) 0.25 MG tablet     Other   Hyperlipidemia    Encouraged heart healthy diet, increase exercise, avoid trans fats, consider a krill oil cap daily      History of chicken pox    Encouraged Shingrix shots      Nystagmus    Bilateral and congenital      Cataract   Hypothyroidism     Significantly elevated TSH, started on Levothyroxine 50 mcg daily      Relevant Medications   levothyroxine (SYNTHROID, LEVOTHROID) 50 MCG tablet   Colon cancer screening    Declines colonoscopy but agrees to [employer], paperwork signed       Other Visit Diagnoses    Encounter for preventive measure    -  Primary   Relevant Orders   CBC (Completed)   Comprehensive metabolic panel (Completed)   Lipid panel (Completed)   TSH (Completed)   PSA (Completed)   Encounter for drug therapy       Relevant Orders   Pain Mgmt, Profile 8 w/Conf, U (Completed)   Low thyroid stimulating hormone (TSH) level       Relevant Orders   TSH      I have discontinued Mr. Antenucci aspirin, ondansetron, hyoscyamine, and hydrOXYzine. I am also having him start on ALPRAZolam and levothyroxine.  Meds ordered this encounter  Medications  . ALPRAZolam (XANAX) 0.25 MG tablet    Sig: Take 1 tablet (0.25 mg total) by mouth 2 (two) times daily as needed for anxiety.    Dispense:  30 tablet    Refill:  2  . levothyroxine (SYNTHROID, LEVOTHROID) 50 MCG tablet    Sig: Take 1 tablet (50 mcg total) by mouth daily.    Dispense:  90 tablet    Refill:  3    CMA served as scribe during this visit. History, Physical and Plan performed by medical provider. Documentation and orders reviewed and attested to.  Penni Homans, MD

## 2017-03-06 LAB — COMPREHENSIVE METABOLIC PANEL
ALT: 9 U/L (ref 0–53)
AST: 16 U/L (ref 0–37)
Albumin: 4.3 g/dL (ref 3.5–5.2)
Alkaline Phosphatase: 60 U/L (ref 39–117)
BILIRUBIN TOTAL: 0.5 mg/dL (ref 0.2–1.2)
BUN: 14 mg/dL (ref 6–23)
CALCIUM: 9.5 mg/dL (ref 8.4–10.5)
CO2: 30 meq/L (ref 19–32)
CREATININE: 0.94 mg/dL (ref 0.40–1.50)
Chloride: 101 mEq/L (ref 96–112)
GFR: 87.74 mL/min (ref >60.00)
GLUCOSE: 77 mg/dL (ref 70–99)
Potassium: 4.3 mEq/L (ref 3.5–5.1)
Sodium: 138 mEq/L (ref 135–145)
Total Protein: 6.8 g/dL (ref 6.0–8.3)

## 2017-03-06 LAB — CBC
HCT: 45.2 % (ref 39.0–52.0)
Hemoglobin: 14.9 g/dL (ref 13.0–17.0)
MCHC: 33 g/dL (ref 30.0–36.0)
MCV: 95.2 fl (ref 78.0–100.0)
PLATELETS: 257 10*3/uL (ref 150.0–400.0)
RBC: 4.75 Mil/uL (ref 4.22–5.81)
RDW: 13.5 % (ref 11.5–15.5)
WBC: 5.9 10*3/uL (ref 4.0–10.5)

## 2017-03-06 LAB — LIPID PANEL
Cholesterol: 225 mg/dL — ABNORMAL HIGH (ref 0–200)
HDL: 36.7 mg/dL — ABNORMAL LOW (ref >39.00)
LDL Cholesterol: 165 mg/dL — ABNORMAL HIGH (ref 0–99)
NONHDL: 188.19
Total CHOL/HDL Ratio: 6
Triglycerides: 116 mg/dL (ref 0.0–149.0)
VLDL: 23.2 mg/dL (ref 0.0–40.0)

## 2017-03-06 LAB — PSA: PSA: 0.75 ng/mL (ref 0.10–4.00)

## 2017-03-06 LAB — TSH: TSH: 35.96 u[IU]/mL — AB (ref 0.35–4.50)

## 2017-03-06 MED ORDER — LEVOTHYROXINE SODIUM 50 MCG PO TABS: 50.0000 ug | ORAL_TABLET | Freq: Every day | ORAL | 3 refills | Status: DC

## 2017-03-07 LAB — PAIN MGMT, PROFILE 8 W/CONF, U
6 Acetylmorphine: NEGATIVE ng/mL (ref <10)
ALCOHOL METABOLITES: NEGATIVE ng/mL (ref <500)
Amphetamines: NEGATIVE ng/mL (ref <500)
Benzodiazepines: NEGATIVE ng/mL (ref <100)
Buprenorphine, Urine: NEGATIVE ng/mL (ref <5)
COCAINE METABOLITE: NEGATIVE ng/mL (ref <150)
CREATININE: 128.5 mg/dL
MARIJUANA METABOLITE: NEGATIVE ng/mL (ref <20)
MDMA: NEGATIVE ng/mL (ref <500)
OPIATES: NEGATIVE ng/mL (ref <100)
Oxidant: NEGATIVE ug/mL (ref <200)
Oxycodone: NEGATIVE ng/mL (ref <100)
PH: 6.55 (ref 4.5–9.0)

## 2017-03-08 ENCOUNTER — Encounter: Payer: Self-pay | Admitting: Family Medicine

## 2017-03-08 DIAGNOSIS — Z1211 Encounter for screening for malignant neoplasm of colon: Secondary | ICD-10-CM

## 2017-03-08 DIAGNOSIS — E039 Hypothyroidism, unspecified: Secondary | ICD-10-CM

## 2017-03-08 DIAGNOSIS — Z0000 Encounter for general adult medical examination without abnormal findings: Secondary | ICD-10-CM | POA: Insufficient documentation

## 2017-03-08 HISTORY — DX: Encounter for screening for malignant neoplasm of colon: Z12.11

## 2017-03-08 HISTORY — DX: Hypothyroidism, unspecified: E03.9

## 2017-03-08 NOTE — Assessment & Plan Note (Signed)
Encouraged heart healthy diet, increase exercise, avoid trans fats, consider a krill oil cap daily 

## 2017-03-08 NOTE — Assessment & Plan Note (Signed)
Significantly elevated TSH, started on Levothyroxine 50 mcg daily

## 2017-03-08 NOTE — Assessment & Plan Note (Signed)
Encouraged Shingrix shots.  

## 2017-03-08 NOTE — Assessment & Plan Note (Signed)
Declines colonoscopy but agrees to [employer], paperwork signed

## 2017-03-08 NOTE — Assessment & Plan Note (Signed)
Bilateral and congenital

## 2017-03-08 NOTE — Assessment & Plan Note (Addendum)
Notes significant stress with adult children, numerous rental properties and more. He has suffered from some panic attacks in past. Not daily. He declines daily med at this time and believes he will not use Alprazolam regularly. He is warned if he needs it frequently we will need to consider SSRI. Contract signed and UDS obtained

## 2017-03-18 DIAGNOSIS — Z1212 Encounter for screening for malignant neoplasm of rectum: Secondary | ICD-10-CM | POA: Diagnosis not present

## 2017-03-18 DIAGNOSIS — Z1211 Encounter for screening for malignant neoplasm of colon: Secondary | ICD-10-CM | POA: Diagnosis not present

## 2017-03-18 LAB — COLOGUARD: Cologuard: NEGATIVE

## 2017-03-26 ENCOUNTER — Telehealth: Payer: Self-pay | Admitting: *Deleted

## 2017-03-26 NOTE — Telephone Encounter (Signed)
Received Negative results from [facility]; forwarded to provider/SLS 10/18

## 2017-04-09 ENCOUNTER — Encounter: Payer: Self-pay | Admitting: Family Medicine

## 2017-05-21 ENCOUNTER — Encounter: Payer: Self-pay | Admitting: Family Medicine

## 2017-09-15 ENCOUNTER — Ambulatory Visit: Payer: 59 | Admitting: Family Medicine

## 2017-09-17 ENCOUNTER — Ambulatory Visit (INDEPENDENT_AMBULATORY_CARE_PROVIDER_SITE_OTHER): Payer: 59 | Admitting: Family Medicine

## 2017-09-17 DIAGNOSIS — N529 Male erectile dysfunction, unspecified: Secondary | ICD-10-CM | POA: Diagnosis not present

## 2017-09-17 DIAGNOSIS — F419 Anxiety disorder, unspecified: Secondary | ICD-10-CM | POA: Diagnosis not present

## 2017-09-17 DIAGNOSIS — Z79899 Other long term (current) drug therapy: Secondary | ICD-10-CM | POA: Diagnosis not present

## 2017-09-17 DIAGNOSIS — E039 Hypothyroidism, unspecified: Secondary | ICD-10-CM

## 2017-09-17 DIAGNOSIS — E785 Hyperlipidemia, unspecified: Secondary | ICD-10-CM

## 2017-09-17 MED ORDER — SILDENAFIL CITRATE 20 MG PO TABS: 20.0000 mg | ORAL_TABLET | Freq: Every evening | ORAL | 2 refills | Status: DC | PRN

## 2017-09-17 MED ORDER — ALPRAZOLAM 0.25 MG PO TABS: 0.2500 mg | ORAL_TABLET | Freq: Two times a day (BID) | ORAL | 2 refills | Status: DC | PRN

## 2017-09-17 NOTE — Progress Notes (Signed)
Subjective:  I acted as a Education administrator for Dr. Charlett Blake. [place], [place]  Patient ID: Kevin Kirby, male    DOB: 13-Oct-1960, 57 y.o.   MRN: [id number]  No chief complaint on file.   HPI  Patient is in today for a 6 month follow up and over all he is dong well. He continues to struggle with anxieyt but it is manageable and he uses Alprazolam infrequently with good results. He notes some increasing trouble with erectile dysfunction and is requesting a medication be considered. No urinary hesitancy or urgency noted. Only new complaint is some righ thand pain but he denies any injury, swelling or warmth. Denies CP/palp/SOB/HA/congestion/fevers/GI or GU c/o. Taking meds as prescribed  Patient Care Team: Mosie Lukes, MD as PCP - General (Family Medicine)   Past Medical History:  Diagnosis Date  . Anxiety    HX PANIC ATTACKS  . Asthma    pediatric and cold weather  . Cataract 03/05/2017  . Colon cancer screening 03/08/2017  . History of chicken pox   . History of kidney stones   . Hypothyroidism 03/08/2017  . Nephrolithiasis    LEFT   . Nystagmus 03/05/2017    Past Surgical History:  Procedure Laterality Date  . CARDIAC CATHETERIZATION  03-11-2009  DR [facility]   NORMAL CORONARY ARTERIES/  NORMAL LVF/ EF 65%  . CLOSED REDUCTION LEFT ARM FX  AGE 13  . CYSTOSCOPY WITH RETROGRADE PYELOGRAM, URETEROSCOPY AND STENT PLACEMENT Left 12/29/2012   Procedure: CYSTOSCOPY WITH RETROGRADE PYELOGRAM, URETEROSCOPY AND STENT PLACEMENT;  Surgeon: Molli Hazard, MD;  Location: [facility];  Service: Urology;  Laterality: Left;  . EXTRACORPOREAL SHOCK WAVE LITHOTRIPSY    . PAROTID GLAND TUMOR EXCISION    . URETEROLITHOTOMY     b/l    Family History  Problem Relation Age of Onset  . Osteoarthritis Mother        DOB 64  . Deep vein thrombosis Mother   . Angina Father        decesed @ 16  . Heart disease Father   . Aneurysm Sister        deceased@ 58, brain  . Brain cancer  Brother        ?tumor  . Healthy Sister   . Interstitial cystitis Daughter     Social History   Socioeconomic History  . Marital status: Married    Spouse name: Not on file  . Number of children: Not on file  . Years of education: Not on file  . Highest education level: Not on file  Occupational History  . Not on file  Social Needs  . Financial resource strain: Not on file  . Food insecurity:    Worry: Not on file    Inability: Not on file  . Transportation needs:    Medical: Not on file    Non-medical: Not on file  Tobacco Use  . Smoking status: Never Smoker  . Smokeless tobacco: Never Used  Substance and Sexual Activity  . Alcohol use: Yes    Alcohol/week: 0.5 oz    Types: 1 Standard drinks or equivalent per week  . Drug use: Not on file  . Sexual activity: Yes  Lifestyle  . Physical activity:    Days per week: Not on file    Minutes per session: Not on file  . Stress: Not on file  Relationships  . Social connections:    Talks on phone: Not on file  Gets together: Not on file    Attends religious service: Not on file    Active member of club or organization: Not on file    Attends meetings of clubs or organizations: Not on file    Relationship status: Not on file  . Intimate partner violence:    Fear of current or ex partner: Not on file    Emotionally abused: Not on file    Physically abused: Not on file    Forced sexual activity: Not on file  Other Topics Concern  . 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 No cigarette, less than 1 beer daily, wears seat belt    Outpatient Medications Prior to Visit  Medication Sig Dispense Refill  . levothyroxine (SYNTHROID, LEVOTHROID) 50 MCG tablet Take 1 tablet (50 mcg total) by mouth daily. 90 tablet 3  . ALPRAZolam (XANAX) 0.25 MG tablet Take 1 tablet (0.25 mg total) by mouth 2 (two) times daily as needed for anxiety. 30  tablet 2   No facility-administered medications prior to visit.     No Known Allergies  Review of Systems  Constitutional: Positive for malaise/fatigue. Negative for fever.  HENT: Negative for congestion.   Eyes: Positive for photophobia. Negative for blurred vision.  Respiratory: Negative for shortness of breath.   Cardiovascular: Negative for chest pain, palpitations and leg swelling.  Gastrointestinal: Negative for abdominal pain, blood in stool and nausea.  Genitourinary: Negative for dysuria and frequency.  Musculoskeletal: Negative for falls.  Skin: Negative for rash.  Neurological: Negative for dizziness, loss of consciousness and headaches.  Endo/Heme/Allergies: Negative for environmental allergies.  Psychiatric/Behavioral: Negative for depression. The patient is nervous/anxious.        Objective:    Physical Exam  Constitutional: He is oriented to person, place, and time. He appears well-developed and well-nourished. No distress.  HENT:  Head: Normocephalic and atraumatic.  Nose: Nose normal.  Eyes: Right eye exhibits no discharge. Left eye exhibits no discharge.  Neck: Normal range of motion. Neck supple.  Cardiovascular: Normal rate and regular rhythm.  No murmur heard. Pulmonary/Chest: Effort normal and breath sounds normal.  Abdominal: Soft. Bowel sounds are normal. There is no tenderness.  Musculoskeletal: He exhibits no edema.  Neurological: He is alert and oriented to person, place, and time.  Skin: Skin is warm and dry.  Psychiatric: He has a normal mood and affect.  Nursing note and vitals reviewed.   There were no vitals taken for this visit. Wt Readings from Last 3 Encounters:  03/05/17 209 lb 6.4 oz (95 kg)  12/26/16 200 lb (90.7 kg)  12/29/12 196 lb 5 oz (89 kg)   BP Readings from Last 3 Encounters:  03/05/17 120/83  12/26/16 131/86  12/29/12 (!) 150/89      There is no immunization history on file for this patient.  Health Maintenance    Topic Date Due  . Hepatitis C Screening  May 01, 1961  . HIV Screening  08/12/1974  . TETANUS/TDAP  08/12/1978  . Fecal DNA (Cologuard)  03/18/2020  . INFLUENZA VACCINE  Discontinued    Lab Results  Component Value Date   WBC 5.9 03/05/2017   HGB 14.9 03/05/2017   HCT 45.2 03/05/2017   PLT 257.0 03/05/2017   GLUCOSE 77 03/05/2017   CHOL 225 (H) 03/05/2017   TRIG 116.0 03/05/2017   HDL 36.70 (L) 03/05/2017   LDLDIRECT 156.7 08/27/2010   LDLCALC 165 (  H) 03/05/2017   ALT 9 03/05/2017   AST 16 03/05/2017   NA 138 03/05/2017   K 4.3 03/05/2017   CL 101 03/05/2017   CREATININE 0.94 03/05/2017   BUN 14 03/05/2017   CO2 30 03/05/2017   TSH 35.96 (H) 03/05/2017   PSA 0.75 03/05/2017   INR 1.1 03/12/2009    Lab Results  Component Value Date   TSH 35.96 (H) 03/05/2017   Lab Results  Component Value Date   WBC 5.9 03/05/2017   HGB 14.9 03/05/2017   HCT 45.2 03/05/2017   MCV 95.2 03/05/2017   PLT 257.0 03/05/2017   Lab Results  Component Value Date   NA 138 03/05/2017   K 4.3 03/05/2017   CO2 30 03/05/2017   GLUCOSE 77 03/05/2017   BUN 14 03/05/2017   CREATININE 0.94 03/05/2017   BILITOT 0.5 03/05/2017   ALKPHOS 60 03/05/2017   AST 16 03/05/2017   ALT 9 03/05/2017   PROT 6.8 03/05/2017   ALBUMIN 4.3 03/05/2017   CALCIUM 9.5 03/05/2017   ANIONGAP 9 12/26/2016   GFR 87.74 03/05/2017   Lab Results  Component Value Date   CHOL 225 (H) 03/05/2017   Lab Results  Component Value Date   HDL 36.70 (L) 03/05/2017   Lab Results  Component Value Date   LDLCALC 165 (H) 03/05/2017   Lab Results  Component Value Date   TRIG 116.0 03/05/2017   Lab Results  Component Value Date   CHOLHDL 6 03/05/2017   No results found for: HGBA1C       Assessment & Plan:   Problem List Items Addressed This Visit      Chronic   Anxiety    Is doing better, continue Alprazolam use infrequently with good results. UDS and contract up to date      Relevant Medications    ALPRAZolam (XANAX) 0.25 MG tablet     Other   Hyperlipidemia    Encouraged heart healthy diet, increase exercise, avoid trans fats, consider a krill oil cap daily      Relevant Medications   sildenafil (REVATIO) 20 MG tablet   Hypothyroidism    Is taking Levothyroxine 50 mcg daily he will repeat TSH and we will monitor      Relevant Orders   TSH   Erectile dysfunction    Notes some intermittent trouble. Will try a course of Revatio and he is warned about the possible side effects and the use of Nitroglycerin.        Other Visit Diagnoses    High risk medication use    -  Primary   Relevant Orders   Pain Mgmt, Profile 8 w/Conf, U (Completed)      I am having Frutoso Chase start on sildenafil. I am also having him maintain his levothyroxine and ALPRAZolam.  Meds ordered this encounter  Medications  . ALPRAZolam (XANAX) 0.25 MG tablet    Sig: Take 1 tablet (0.25 mg total) by mouth 2 (two) times daily as needed for anxiety.    Dispense:  30 tablet    Refill:  2  . sildenafil (REVATIO) 20 MG tablet    Sig: Take 1-4 tablets (20-80 mg total) by mouth at bedtime as needed.    Dispense:  35 tablet    Refill:  2    CMA served as scribe during this visit. History, Physical and Plan performed by medical provider. Documentation and orders reviewed and attested to.  Penni Homans, MD

## 2017-09-17 NOTE — Patient Instructions (Signed)
Lidocaine gel from [employer], [employer] or [facility] Hand Pain Many things can cause hand pain. Some common causes are:  An injury.  Repeating the same movement with your hand over and over (overuse).  Osteoporosis.  Arthritis.  Lumps in the tendons or joints of the hand and wrist (ganglion cysts).  Infection.  Follow these instructions at home: Pay attention to any changes in your symptoms. Take these actions to help with your discomfort:  If directed, put ice on the affected area: ? Put ice in a plastic bag. ? Place a towel between your skin and the bag. ? Leave the ice on for 15-20 minutes, 3?4 times a day for 2 days.  Take over-the-counter and prescription medicines only as told by your health care provider.  Minimize stress on your hands and wrists as much as possible.  Take breaks from repetitive activity often.  Do stretches as told by your health care provider.  Do not do activities that make your pain worse.  Contact a health care provider if:  Your pain does not get better after a few days of self-care.  Your pain gets worse.  Your pain affects your ability to do your daily activities. Get help right away if:  Your hand becomes warm, red, or swollen.  Your hand is numb or tingling.  Your hand is extremely swollen or deformed.  Your hand or fingers turn white or blue.  You cannot move your hand, wrist, or fingers. This information is not intended to replace advice given to you by your health care provider. Make sure you discuss any questions you have with your health care provider. Document Released: 06/22/2015 Document Revised: 11/01/2015 Document Reviewed: 06/21/2014 Elsevier Interactive Patient Education  [employer].

## 2017-09-18 LAB — PAIN MGMT, PROFILE 8 W/CONF, U
6 ACETYLMORPHINE: NEGATIVE ng/mL (ref <10)
AMPHETAMINES: NEGATIVE ng/mL (ref <500)
Alcohol Metabolites: NEGATIVE ng/mL (ref <500)
Benzodiazepines: NEGATIVE ng/mL (ref <100)
Buprenorphine, Urine: NEGATIVE ng/mL (ref <5)
Cocaine Metabolite: NEGATIVE ng/mL (ref <150)
Creatinine: 170.4 mg/dL
MDMA: NEGATIVE ng/mL (ref <500)
Marijuana Metabolite: NEGATIVE ng/mL (ref <20)
OPIATES: NEGATIVE ng/mL (ref <100)
OXIDANT: NEGATIVE ug/mL (ref <200)
OXYCODONE: NEGATIVE ng/mL (ref <100)
pH: 7.31 (ref 4.5–9.0)

## 2017-09-20 DIAGNOSIS — N529 Male erectile dysfunction, unspecified: Secondary | ICD-10-CM | POA: Insufficient documentation

## 2017-09-20 NOTE — Assessment & Plan Note (Signed)
Is taking Levothyroxine 50 mcg daily he will repeat TSH and we will monitor

## 2017-09-20 NOTE — Assessment & Plan Note (Signed)
Notes some intermittent trouble. Will try a course of Revatio and he is warned about the possible side effects and the use of Nitroglycerin.

## 2017-09-20 NOTE — Assessment & Plan Note (Signed)
Encouraged heart healthy diet, increase exercise, avoid trans fats, consider a krill oil cap daily 

## 2017-09-20 NOTE — Assessment & Plan Note (Addendum)
Is doing better, continue Alprazolam use infrequently with good results. UDS and contract up to date

## 2017-12-17 ENCOUNTER — Ambulatory Visit: Payer: 59 | Admitting: Family Medicine

## 2017-12-17 ENCOUNTER — Other Ambulatory Visit (INDEPENDENT_AMBULATORY_CARE_PROVIDER_SITE_OTHER): Payer: 59

## 2017-12-17 DIAGNOSIS — E039 Hypothyroidism, unspecified: Secondary | ICD-10-CM

## 2017-12-18 LAB — TSH: TSH: 16.82 u[IU]/mL — AB (ref 0.35–4.50)

## 2017-12-24 ENCOUNTER — Ambulatory Visit (INDEPENDENT_AMBULATORY_CARE_PROVIDER_SITE_OTHER): Payer: 59 | Admitting: Family Medicine

## 2017-12-24 VITALS — BP 118/70 | HR 82 | Temp 98.2°F | Resp 18 | Wt 213.0 lb

## 2017-12-24 DIAGNOSIS — N529 Male erectile dysfunction, unspecified: Secondary | ICD-10-CM

## 2017-12-24 DIAGNOSIS — E039 Hypothyroidism, unspecified: Secondary | ICD-10-CM

## 2017-12-24 DIAGNOSIS — L578 Other skin changes due to chronic exposure to nonionizing radiation: Secondary | ICD-10-CM | POA: Diagnosis not present

## 2017-12-24 DIAGNOSIS — F419 Anxiety disorder, unspecified: Secondary | ICD-10-CM | POA: Diagnosis not present

## 2017-12-24 DIAGNOSIS — K219 Gastro-esophageal reflux disease without esophagitis: Secondary | ICD-10-CM

## 2017-12-24 DIAGNOSIS — E785 Hyperlipidemia, unspecified: Secondary | ICD-10-CM

## 2017-12-24 DIAGNOSIS — M79641 Pain in right hand: Secondary | ICD-10-CM | POA: Diagnosis not present

## 2017-12-24 MED ORDER — SILDENAFIL CITRATE 20 MG PO TABS: 20.0000 mg | ORAL_TABLET | Freq: Every evening | ORAL | 3 refills | Status: DC | PRN

## 2017-12-24 MED ORDER — RANITIDINE HCL 300 MG PO TABS: 300.0000 mg | ORAL_TABLET | Freq: Every day | ORAL | 3 refills | Status: AC

## 2017-12-24 MED ORDER — LEVOTHYROXINE SODIUM 100 MCG PO TABS: 100.0000 ug | ORAL_TABLET | Freq: Every day | ORAL | 5 refills | Status: DC

## 2017-12-24 MED ORDER — ALPRAZOLAM 0.25 MG PO TABS: 0.2500 mg | ORAL_TABLET | Freq: Two times a day (BID) | ORAL | 4 refills | Status: AC | PRN

## 2017-12-24 NOTE — Patient Instructions (Addendum)
Shingrix is the new shingles shot 2 shots over 2-6 months  Food Choices for Gastroesophageal Reflux Disease, Adult When you have gastroesophageal reflux disease (GERD), the foods you eat and your eating habits are very important. Choosing the right foods can help ease your discomfort. What guidelines do I need to follow?  Choose fruits, vegetables, whole grains, and low-fat dairy products.  Choose low-fat meat, fish, and poultry.  Limit fats such as oils, salad dressings, butter, nuts, and avocado.  Keep a food diary. This helps you identify foods that cause symptoms.  Avoid foods that cause symptoms. These may be different for everyone.  Eat small meals often instead of 3 large meals a day.  Eat your meals slowly, in a place where you are relaxed.  Limit fried foods.  Cook foods using methods other than frying.  Avoid drinking alcohol.  Avoid drinking large amounts of liquids with your meals.  Avoid bending over or lying down until 2-3 hours after eating. What foods are not recommended? These are some foods and drinks that may make your symptoms worse: Vegetables Tomatoes. Tomato juice. Tomato and spaghetti sauce. Chili peppers. Onion and garlic. Horseradish. Fruits Oranges, grapefruit, and lemon (fruit and juice). Meats High-fat meats, fish, and poultry. This includes hot dogs, ribs, ham, sausage, salami, and bacon. Dairy Whole milk and chocolate milk. Sour cream. Cream. Butter. Ice cream. Cream cheese. Drinks Coffee and tea. Bubbly (carbonated) drinks or energy drinks. Condiments Hot sauce. Barbecue sauce. Sweets/Desserts Chocolate and cocoa. Donuts. Peppermint and spearmint. Fats and Oils High-fat foods. This includes [place] fries and potato chips. Other Vinegar. Strong spices. This includes black pepper, white pepper, red pepper, cayenne, curry powder, cloves, ginger, and chili powder. The items listed above may not be a complete list of foods and drinks to  avoid. Contact your dietitian for more information. This information is not intended to replace advice given to you by your health care provider. Make sure you discuss any questions you have with your health care provider. Document Released: 11/25/2011 Document Revised: 11/01/2015 Document Reviewed: 03/30/2013 Elsevier Interactive Patient Education  2017 [employer].

## 2017-12-24 NOTE — Progress Notes (Signed)
Subjective:  I acted as a Education administrator for Dr. Charlett Blake. [place], [place]  Patient ID: Kevin Kirby, male    DOB: 1960/08/13, 57 y.o.   MRN: [id number]  Chief Complaint  Patient presents with  . Follow-up    HPI  Patient is in today for 3 month follow up and despite starting levothyroxine he continues to struggle with fatigue.  He notes that may be slightly improved.  No recent febrile illness or hospitalizations.  He is noting ongoing trouble with right hand pain which is slowly worsening.  He denies any trauma or falls.  No redness, swelling or warmth.  Notes stiffness in the hand as well.  She has intermittent trouble with reflux.  He never was able to pick up his sildenafil due to cost. Denies CP/palp/SOB/HA/congestion/fevers/GI c/o. Taking meds as prescribed  Patient Care Team: Mosie Lukes, MD as PCP - General (Family Medicine)   Past Medical History:  Diagnosis Date  . Anxiety    HX PANIC ATTACKS  . Asthma    pediatric and cold weather  . Cataract 03/05/2017  . Colon cancer screening 03/08/2017  . History of chicken pox   . History of kidney stones   . Hypothyroidism 03/08/2017  . Nephrolithiasis    LEFT   . Nystagmus 03/05/2017    Past Surgical History:  Procedure Laterality Date  . CARDIAC CATHETERIZATION  03-11-2009  DR [facility]   NORMAL CORONARY ARTERIES/  NORMAL LVF/ EF 65%  . CLOSED REDUCTION LEFT ARM FX  AGE 52  . CYSTOSCOPY WITH RETROGRADE PYELOGRAM, URETEROSCOPY AND STENT PLACEMENT Left 12/29/2012   Procedure: CYSTOSCOPY WITH RETROGRADE PYELOGRAM, URETEROSCOPY AND STENT PLACEMENT;  Surgeon: Molli Hazard, MD;  Location: [facility];  Service: Urology;  Laterality: Left;  . EXTRACORPOREAL SHOCK WAVE LITHOTRIPSY    . PAROTID GLAND TUMOR EXCISION    . URETEROLITHOTOMY     b/l    Family History  Problem Relation Age of Onset  . Osteoarthritis Mother        DOB 72  . Deep vein thrombosis Mother   . Angina Father        decesed @ 82  .  Heart disease Father   . Aneurysm Sister        deceased@ 58, brain  . Brain cancer Brother        ?tumor  . Healthy Sister   . Interstitial cystitis Daughter     Social History   Socioeconomic History  . Marital status: Married    Spouse name: Not on file  . Number of children: Not on file  . Years of education: Not on file  . Highest education level: Not on file  Occupational History  . Not on file  Social Needs  . Financial resource strain: Not on file  . Food insecurity:    Worry: Not on file    Inability: Not on file  . Transportation needs:    Medical: Not on file    Non-medical: Not on file  Tobacco Use  . Smoking status: Never Smoker  . Smokeless tobacco: Never Used  Substance and Sexual Activity  . Alcohol use: Yes    Alcohol/week: 0.6 oz    Types: 1 Standard drinks or equivalent per week  . Drug use: Not on file  . Sexual activity: Yes  Lifestyle  . Physical activity:    Days per week: Not on file    Minutes per session: Not on file  . Stress: Not  on file  Relationships  . Social connections:    Talks on phone: Not on file    Gets together: Not on file    Attends religious service: Not on file    Active member of club or organization: Not on file    Attends meetings of clubs or organizations: Not on file    Relationship status: Not on file  . Intimate partner violence:    Fear of current or ex partner: Not on file    Emotionally abused: Not on file    Physically abused: Not on file    Forced sexual activity: Not on file  Other Topics Concern  . 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 No cigarette, less than 1 beer daily, wears seat belt    Outpatient Medications Prior to Visit  Medication Sig Dispense Refill  . ALPRAZolam (XANAX) 0.25 MG tablet Take 1 tablet (0.25 mg total) by mouth 2 (two) times daily as needed for anxiety. 30 tablet 2  . levothyroxine  (SYNTHROID, LEVOTHROID) 50 MCG tablet Take 1 tablet (50 mcg total) by mouth daily. 90 tablet 3  . sildenafil (REVATIO) 20 MG tablet Take 1-4 tablets (20-80 mg total) by mouth at bedtime as needed. (Patient not taking: Reported on 12/24/2017) 35 tablet 2   No facility-administered medications prior to visit.     No Known Allergies  Review of Systems  Constitutional: Positive for malaise/fatigue. Negative for fever.  HENT: Negative for congestion.   Eyes: Negative for blurred vision.  Respiratory: Negative for shortness of breath.   Cardiovascular: Negative for chest pain, palpitations and leg swelling.  Gastrointestinal: Positive for heartburn. Negative for abdominal pain, blood in stool and nausea.  Genitourinary: Negative for dysuria and frequency.  Musculoskeletal: Positive for joint pain. Negative for falls.  Skin: Negative for rash.  Neurological: Negative for dizziness, loss of consciousness and headaches.  Endo/Heme/Allergies: Negative for environmental allergies.  Psychiatric/Behavioral: Negative for depression. The patient is not nervous/anxious.        Objective:    Physical Exam  Constitutional: He is oriented to person, place, and time. He appears well-developed and well-nourished. No distress.  HENT:  Head: Normocephalic and atraumatic.  Nose: Nose normal.  Eyes: Right eye exhibits no discharge. Left eye exhibits no discharge.  Neck: Normal range of motion. Neck supple.  Cardiovascular: Normal rate and regular rhythm.  No murmur heard. Pulmonary/Chest: Effort normal and breath sounds normal.  Abdominal: Soft. Bowel sounds are normal. There is no tenderness.  Musculoskeletal: He exhibits no edema.  Neurological: He is alert and oriented to person, place, and time.  Skin: Skin is warm and dry.  Psychiatric: He has a normal mood and affect.  Nursing note and vitals reviewed.   BP 118/70 (BP Location: Left Arm, Patient Position: Sitting, Cuff Size: Normal)   Pulse  82   Temp 98.2 F (36.8 C) (Oral)   Resp 18   Wt 213 lb (96.6 kg)   SpO2 96%   BMI 30.56 kg/m  Wt Readings from Last 3 Encounters:  12/24/17 213 lb (96.6 kg)  03/05/17 209 lb 6.4 oz (95 kg)  12/26/16 200 lb (90.7 kg)   BP Readings from Last 3 Encounters:  12/24/17 118/70  03/05/17 120/83  12/26/16 131/86      There is no immunization history on file for this patient.  Health Maintenance  Topic Date Due  . Hepatitis C Screening  09[birth year]  . HIV Screening  08/12/1974  . TETANUS/TDAP  08/12/1978  . Fecal DNA (Cologuard)  03/18/2020  . INFLUENZA VACCINE  Discontinued    Lab Results  Component Value Date   WBC 5.9 03/05/2017   HGB 14.9 03/05/2017   HCT 45.2 03/05/2017   PLT 257.0 03/05/2017   GLUCOSE 77 03/05/2017   CHOL 225 (H) 03/05/2017   TRIG 116.0 03/05/2017   HDL 36.70 (L) 03/05/2017   LDLDIRECT 156.7 08/27/2010   LDLCALC 165 (H) 03/05/2017   ALT 9 03/05/2017   AST 16 03/05/2017   NA 138 03/05/2017   K 4.3 03/05/2017   CL 101 03/05/2017   CREATININE 0.94 03/05/2017   BUN 14 03/05/2017   CO2 30 03/05/2017   TSH 16.82 (H) 12/17/2017   PSA 0.75 03/05/2017   INR 1.1 03/12/2009    Lab Results  Component Value Date   TSH 16.82 (H) 12/17/2017   Lab Results  Component Value Date   WBC 5.9 03/05/2017   HGB 14.9 03/05/2017   HCT 45.2 03/05/2017   MCV 95.2 03/05/2017   PLT 257.0 03/05/2017   Lab Results  Component Value Date   NA 138 03/05/2017   K 4.3 03/05/2017   CO2 30 03/05/2017   GLUCOSE 77 03/05/2017   BUN 14 03/05/2017   CREATININE 0.94 03/05/2017   BILITOT 0.5 03/05/2017   ALKPHOS 60 03/05/2017   AST 16 03/05/2017   ALT 9 03/05/2017   PROT 6.8 03/05/2017   ALBUMIN 4.3 03/05/2017   CALCIUM 9.5 03/05/2017   ANIONGAP 9 12/26/2016   GFR 87.74 03/05/2017   Lab Results  Component Value Date   CHOL 225 (H) 03/05/2017   Lab Results  Component Value Date   HDL 36.70 (L) 03/05/2017   Lab Results  Component Value Date   LDLCALC 165  (H) 03/05/2017   Lab Results  Component Value Date   TRIG 116.0 03/05/2017   Lab Results  Component Value Date   CHOLHDL 6 03/05/2017   No results found for: HGBA1C       Assessment & Plan:   Problem List Items Addressed This Visit      Chronic   Anxiety   Relevant Medications   ALPRAZolam (XANAX) 0.25 MG tablet     Other   Hyperlipidemia    Encouraged heart healthy diet, increase exercise, avoid trans fats, consider a krill oil cap daily      Relevant Medications   sildenafil (REVATIO) 20 MG tablet   Hypothyroidism    TSH improving but still TSH is elevated. Increase Levothyroxine and continue to monitor which should help his fatigue      Relevant Medications   levothyroxine (SYNTHROID, LEVOTHROID) 100 MCG tablet   Other Relevant Orders   TSH   Erectile dysfunction    Was unable to pick up Sildenafil due to cost previously is given another rx today to shop around      Hand pain, right - Primary    Referred to hand surgeon for persistent pain.       Relevant Orders   Ambulatory referral to Hand Surgery   Uric acid   Sun-damaged skin   Relevant Orders   Ambulatory referral to Dermatology   Acid reflux    Avoid offending foods, start probiotics. Do not eat large meals in late evening and consider raising head of bed.       Relevant Medications   ranitidine (ZANTAC) 300 MG tablet      I have  discontinued Camelia Eng. Buesing's levothyroxine. I have also changed his sildenafil. Additionally, I am having him start on levothyroxine and ranitidine. Lastly, I am having him maintain his ALPRAZolam.  Meds ordered this encounter  Medications  . ALPRAZolam (XANAX) 0.25 MG tablet    Sig: Take 1 tablet (0.25 mg total) by mouth 2 (two) times daily as needed for anxiety.    Dispense:  30 tablet    Refill:  4  . levothyroxine (SYNTHROID, LEVOTHROID) 100 MCG tablet    Sig: Take 1 tablet (100 mcg total) by mouth daily.    Dispense:  30 tablet    Refill:  5  .  sildenafil (REVATIO) 20 MG tablet    Sig: Take 1-5 tablets (20-100 mg total) by mouth at bedtime as needed.    Dispense:  35 tablet    Refill:  3  . ranitidine (ZANTAC) 300 MG tablet    Sig: Take 1 tablet (300 mg total) by mouth at bedtime.    Dispense:  30 tablet    Refill:  3    CMA served as scribe during this visit. History, Physical and Plan performed by medical provider. Documentation and orders reviewed and attested to.  Penni Homans, MD

## 2017-12-27 DIAGNOSIS — K219 Gastro-esophageal reflux disease without esophagitis: Secondary | ICD-10-CM | POA: Insufficient documentation

## 2017-12-27 DIAGNOSIS — L578 Other skin changes due to chronic exposure to nonionizing radiation: Secondary | ICD-10-CM | POA: Insufficient documentation

## 2017-12-27 DIAGNOSIS — M79641 Pain in right hand: Secondary | ICD-10-CM | POA: Insufficient documentation

## 2017-12-27 NOTE — Assessment & Plan Note (Signed)
Avoid offending foods, start probiotics. Do not eat large meals in late evening and consider raising head of bed.  

## 2017-12-27 NOTE — Assessment & Plan Note (Signed)
Referred to hand surgeon for persistent pain.

## 2017-12-27 NOTE — Assessment & Plan Note (Signed)
Was unable to pick up Sildenafil due to cost previously is given another rx today to shop around

## 2017-12-27 NOTE — Assessment & Plan Note (Signed)
TSH improving but still TSH is elevated. Increase Levothyroxine and continue to monitor which should help his fatigue

## 2017-12-27 NOTE — Assessment & Plan Note (Signed)
Encouraged heart healthy diet, increase exercise, avoid trans fats, consider a krill oil cap daily 

## 2018-01-07 DIAGNOSIS — M79641 Pain in right hand: Secondary | ICD-10-CM | POA: Diagnosis not present

## 2018-03-26 ENCOUNTER — Other Ambulatory Visit (INDEPENDENT_AMBULATORY_CARE_PROVIDER_SITE_OTHER): Payer: 59

## 2018-03-26 DIAGNOSIS — M79641 Pain in right hand: Secondary | ICD-10-CM

## 2018-03-26 DIAGNOSIS — E039 Hypothyroidism, unspecified: Secondary | ICD-10-CM | POA: Diagnosis not present

## 2018-03-26 LAB — URIC ACID: URIC ACID, SERUM: 5.7 mg/dL (ref 4.0–7.8)

## 2018-03-26 LAB — TSH: TSH: 1.4 u[IU]/mL (ref 0.35–4.50)

## 2018-06-21 ENCOUNTER — Other Ambulatory Visit: Payer: Self-pay | Admitting: Family Medicine

## 2018-06-29 ENCOUNTER — Encounter: Payer: 59 | Admitting: Family Medicine

## 2018-07-12 DIAGNOSIS — L570 Actinic keratosis: Secondary | ICD-10-CM | POA: Diagnosis not present

## 2018-07-12 DIAGNOSIS — D1801 Hemangioma of skin and subcutaneous tissue: Secondary | ICD-10-CM | POA: Diagnosis not present

## 2018-07-12 DIAGNOSIS — R52 Pain, unspecified: Secondary | ICD-10-CM | POA: Diagnosis not present

## 2018-07-12 DIAGNOSIS — L814 Other melanin hyperpigmentation: Secondary | ICD-10-CM | POA: Diagnosis not present

## 2018-07-12 DIAGNOSIS — L821 Other seborrheic keratosis: Secondary | ICD-10-CM | POA: Diagnosis not present

## 2018-08-10 ENCOUNTER — Encounter: Payer: Self-pay | Admitting: Family Medicine

## 2018-08-10 ENCOUNTER — Ambulatory Visit (INDEPENDENT_AMBULATORY_CARE_PROVIDER_SITE_OTHER): Payer: 59 | Admitting: Family Medicine

## 2018-08-10 ENCOUNTER — Other Ambulatory Visit: Payer: Self-pay

## 2018-08-10 VITALS — BP 120/68 | HR 87 | Temp 98.1°F | Resp 18 | Ht 70.0 in | Wt 210.4 lb

## 2018-08-10 DIAGNOSIS — N200 Calculus of kidney: Secondary | ICD-10-CM | POA: Diagnosis not present

## 2018-08-10 DIAGNOSIS — Z0000 Encounter for general adult medical examination without abnormal findings: Secondary | ICD-10-CM

## 2018-08-10 DIAGNOSIS — E039 Hypothyroidism, unspecified: Secondary | ICD-10-CM | POA: Diagnosis not present

## 2018-08-10 DIAGNOSIS — N486 Induration penis plastica: Secondary | ICD-10-CM | POA: Diagnosis not present

## 2018-08-10 DIAGNOSIS — R3911 Hesitancy of micturition: Secondary | ICD-10-CM | POA: Diagnosis not present

## 2018-08-10 DIAGNOSIS — Z1211 Encounter for screening for malignant neoplasm of colon: Secondary | ICD-10-CM

## 2018-08-10 DIAGNOSIS — E785 Hyperlipidemia, unspecified: Secondary | ICD-10-CM | POA: Diagnosis not present

## 2018-08-10 NOTE — Patient Instructions (Addendum)
shingrix is the new shingles shot 2 shots over 2-6 months check with insurance coverage, document  Bring [place] a copy of the advanced directives if you do them Preventive Care 40-64 Years, Male Preventive care refers to lifestyle choices and visits with your health care provider that can promote health and wellness. What does preventive care include?   A yearly physical exam. This is also called an annual well check.  Dental exams once or twice a year.  Routine eye exams. Ask your health care provider how often you should have your eyes checked.  Personal lifestyle choices, including: ? Daily care of your teeth and gums. ? Regular physical activity. ? Eating a healthy diet. ? Avoiding tobacco and drug use. ? Limiting alcohol use. ? Practicing safe sex. ? Taking low-dose aspirin every day starting at age 53. What happens during an annual well check? The services and screenings done by your health care provider during your annual well check will depend on your age, overall health, lifestyle risk factors, and family history of disease. Counseling Your health care provider may ask you questions about your:  Alcohol use.  Tobacco use.  Drug use.  Emotional well-being.  Home and relationship well-being.  Sexual activity.  Eating habits.  Work and work Statistician. Screening You may have the following tests or measurements:  Height, weight, and BMI.  Blood pressure.  Lipid and cholesterol levels. These may be checked every 5 years, or more frequently if you are over 75 years old.  Skin check.  Lung cancer screening. You may have this screening every year starting at age 77 if you have a 30-pack-year history of smoking and currently smoke or have quit within the past 15 years.  Colorectal cancer screening. All adults should have this screening starting at age 67 and continuing until age 73. Your health care provider may recommend screening at age 63. You will have tests  every 1-10 years, depending on your results and the type of screening test. People at increased risk should start screening at an earlier age. Screening tests may include: ? Guaiac-based fecal occult blood testing. ? Fecal immunochemical test (FIT). ? Stool DNA test. ? Virtual colonoscopy. ? Sigmoidoscopy. During this test, a flexible tube with a tiny camera (sigmoidoscope) is used to examine your rectum and lower colon. The sigmoidoscope is inserted through your anus into your rectum and lower colon. ? Colonoscopy. During this test, a long, thin, flexible tube with a tiny camera (colonoscope) is used to examine your entire colon and rectum.  Prostate cancer screening. Recommendations will vary depending on your family history and other risks.  Hepatitis C blood test.  Hepatitis B blood test.  Sexually transmitted disease (STD) testing.  Diabetes screening. This is done by checking your blood sugar (glucose) after you have not eaten for a while (fasting). You may have this done every 1-3 years. Discuss your test results, treatment options, and if necessary, the need for more tests with your health care provider. Vaccines Your health care provider may recommend certain vaccines, such as:  Influenza vaccine. This is recommended every year.  Tetanus, diphtheria, and acellular pertussis (Tdap, Td) vaccine. You may need a Td booster every 10 years.  Varicella vaccine. You may need this if you have not been vaccinated.  Zoster vaccine. You may need this after age 58.  Measles, mumps, and rubella (MMR) vaccine. You may need at least one dose of MMR if you were born in 1957 or later. You may also  need a second dose.  Pneumococcal 13-valent conjugate (PCV13) vaccine. You may need this if you have certain conditions and have not been vaccinated.  Pneumococcal polysaccharide (PPSV23) vaccine. You may need one or two doses if you smoke cigarettes or if you have certain  conditions.  Meningococcal vaccine. You may need this if you have certain conditions.  Hepatitis A vaccine. You may need this if you have certain conditions or if you travel or work in places where you may be exposed to hepatitis A.  Hepatitis B vaccine. You may need this if you have certain conditions or if you travel or work in places where you may be exposed to hepatitis B.  Haemophilus influenzae type b (Hib) vaccine. You may need this if you have certain risk factors. Talk to your health care provider about which screenings and vaccines you need and how often you need them. This information is not intended to replace advice given to you by your health care provider. Make sure you discuss any questions you have with your health care provider. Document Released: 06/22/2015 Document Revised: 07/16/2017 Document Reviewed: 03/27/2015 Elsevier Interactive Patient Education  2019 [employer].

## 2018-08-10 NOTE — Progress Notes (Signed)
Subjective:    Patient ID: Kevin Kirby, male    DOB: 10-15-1960, 58 y.o.   MRN: [id number]  No chief complaint on file.   HPI Patient is in today for annual preventive exam and follow-up on chronic medical concerns such as hyperlipidemia and hypothyroidism.  He notes about 2 months ago he noticed a curvature in the penile shaft.  Denies any trauma or injury.  He does endorse some urinary hesitancy but denies any dysuria, hematuria.  He decreased his levothyroxine from 100 mcg to 50 mcg at that time to see if it made a difference but it did not.  He is managing his activities of daily living well.  No recent febrile illness or hospitalizations.  He stays active and tries to maintain a heart healthy diet. Denies CP/palp/SOB/HA/congestion/fevers/GI c/o. Taking meds as prescribed  Past Medical History:  Diagnosis Date  . Anxiety    HX PANIC ATTACKS  . Asthma    pediatric and cold weather  . Cataract 03/05/2017  . Colon cancer screening 03/08/2017  . History of chicken pox   . History of kidney stones   . Hypothyroidism 03/08/2017  . Nephrolithiasis    LEFT   . Nystagmus 03/05/2017    Past Surgical History:  Procedure Laterality Date  . CARDIAC CATHETERIZATION  03-11-2009  DR [facility]   NORMAL CORONARY ARTERIES/  NORMAL LVF/ EF 65%  . CLOSED REDUCTION LEFT ARM FX  AGE 7  . CYSTOSCOPY WITH RETROGRADE PYELOGRAM, URETEROSCOPY AND STENT PLACEMENT Left 12/29/2012   Procedure: CYSTOSCOPY WITH RETROGRADE PYELOGRAM, URETEROSCOPY AND STENT PLACEMENT;  Surgeon: Molli Hazard, MD;  Location: [facility];  Service: Urology;  Laterality: Left;  . EXTRACORPOREAL SHOCK WAVE LITHOTRIPSY    . PAROTID GLAND TUMOR EXCISION    . URETEROLITHOTOMY     b/l    Family History  Problem Relation Age of Onset  . Osteoarthritis Mother        DOB 30  . Deep vein thrombosis Mother   . Angina Father        decesed @ 81  . Heart disease Father   . Aneurysm Sister        deceased@  58, brain  . Brain cancer Brother        ?tumor  . Healthy Sister   . Interstitial cystitis Daughter     Social History   Socioeconomic History  . Marital status: Married    Spouse name: Not on file  . Number of children: Not on file  . Years of education: Not on file  . Highest education level: Not on file  Occupational History  . Not on file  Social Needs  . Financial resource strain: Not on file  . Food insecurity:    Worry: Not on file    Inability: Not on file  . Transportation needs:    Medical: Not on file    Non-medical: Not on file  Tobacco Use  . Smoking status: Never Smoker  . Smokeless tobacco: Never Used  Substance and Sexual Activity  . Alcohol use: Yes    Alcohol/week: 1.0 standard drinks    Types: 1 Standard drinks or equivalent per week  . Drug use: Not on file  . Sexual activity: Yes  Lifestyle  . Physical activity:    Days per week: Not on file    Minutes per session: Not on file  . Stress: Not on file  Relationships  . Social connections:    Talks  on phone: Not on file    Gets together: Not on file    Attends religious service: Not on file    Active member of club or organization: Not on file    Attends meetings of clubs or organizations: Not on file    Relationship status: Not on file  . Intimate partner violence:    Fear of current or ex partner: Not on file    Emotionally abused: Not on file    Physically abused: Not on file    Forced sexual activity: Not on file  Other Topics Concern  . 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 No cigarette, less than 1 beer daily, wears seat belt    Outpatient Medications Prior to Visit  Medication Sig Dispense Refill  . ALPRAZolam (XANAX) 0.25 MG tablet Take 1 tablet (0.25 mg total) by mouth 2 (two) times daily as needed for anxiety. 30 tablet 4  . levothyroxine (SYNTHROID, LEVOTHROID) 100 MCG tablet TAKE 1  TABLET BY MOUTH EVERY DAY 30 tablet 5  . ranitidine (ZANTAC) 300 MG tablet Take 1 tablet (300 mg total) by mouth at bedtime. 30 tablet 3  . sildenafil (REVATIO) 20 MG tablet Take 1-5 tablets (20-100 mg total) by mouth at bedtime as needed. 35 tablet 3   No facility-administered medications prior to visit.     No Known Allergies  Review of Systems  Constitutional: Negative for chills, fever and malaise/fatigue.  HENT: Negative for congestion and hearing loss.   Eyes: Negative for discharge.  Respiratory: Negative for cough, sputum production and shortness of breath.   Cardiovascular: Negative for chest pain, palpitations and leg swelling.  Gastrointestinal: Negative for abdominal pain, blood in stool, constipation, diarrhea, heartburn, nausea and vomiting.  Genitourinary: Negative for dysuria, frequency, hematuria and urgency.  Musculoskeletal: Negative for back pain, falls and myalgias.  Skin: Negative for rash.  Neurological: Negative for dizziness, sensory change, loss of consciousness, weakness and headaches.  Endo/Heme/Allergies: Negative for environmental allergies. Does not bruise/bleed easily.  Psychiatric/Behavioral: Negative for depression and suicidal ideas. The patient is not nervous/anxious and does not have insomnia.        Objective:    Physical Exam Vitals signs and nursing note reviewed.  Constitutional:      General: He is not in acute distress.    Appearance: He is well-developed.  HENT:     Head: Normocephalic and atraumatic.     Right Ear: Tympanic membrane, ear canal and external ear normal.     Left Ear: Tympanic membrane, ear canal and external ear normal.     Nose: Nose normal. No congestion.     Mouth/Throat:     Mouth: Mucous membranes are moist.  Eyes:     General:        Right eye: No discharge.        Left eye: No discharge.     Extraocular Movements: Extraocular movements intact.     Conjunctiva/sclera: Conjunctivae normal.     Pupils: Pupils  are equal, round, and reactive to light.  Neck:     Musculoskeletal: Normal range of motion and neck supple.  Cardiovascular:     Rate and Rhythm: Normal rate and regular rhythm.     Heart sounds: No murmur.  Pulmonary:     Effort: Pulmonary effort is normal.     Breath sounds: Normal breath sounds. No wheezing.  Abdominal:  General: Bowel sounds are normal.     Palpations: Abdomen is soft. There is no mass.     Tenderness: There is no abdominal tenderness. There is no guarding or rebound.  Musculoskeletal: Normal range of motion.        General: No swelling or tenderness.  Skin:    General: Skin is warm and dry.  Neurological:     General: No focal deficit present.     Mental Status: He is alert and oriented to person, place, and time.  Psychiatric:        Mood and Affect: Mood normal.        Behavior: Behavior normal.     BP 120/68 (BP Location: Left Arm, Patient Position: Sitting, Cuff Size: Normal)   Pulse 87   Temp 98.1 F (36.7 C) (Oral)   Resp 18   Ht 5\' 10"  (1.778 m)   Wt 210 lb 6.4 oz (95.4 kg)   SpO2 95%   BMI 30.19 kg/m  Wt Readings from Last 3 Encounters:  08/10/18 210 lb 6.4 oz (95.4 kg)  12/24/17 213 lb (96.6 kg)  03/05/17 209 lb 6.4 oz (95 kg)     Lab Results  Component Value Date   WBC 6.5 08/10/2018   HGB 16.0 08/10/2018   HCT 46.7 08/10/2018   PLT 290.0 08/10/2018   GLUCOSE 67 (L) 08/10/2018   CHOL 227 (H) 08/10/2018   TRIG 389.0 (H) 08/10/2018   HDL 35.70 (L) 08/10/2018   LDLDIRECT 165.0 08/10/2018   LDLCALC 165 (H) 03/05/2017   ALT 11 08/10/2018   AST 18 08/10/2018   NA 139 08/10/2018   K 4.4 08/10/2018   CL 102 08/10/2018   CREATININE 1.03 08/10/2018   BUN 14 08/10/2018   CO2 30 08/10/2018   TSH 15.73 (H) 08/10/2018   PSA 0.78 08/10/2018   INR 1.1 03/12/2009    Lab Results  Component Value Date   TSH 15.73 (H) 08/10/2018   Lab Results  Component Value Date   WBC 6.5 08/10/2018   HGB 16.0 08/10/2018   HCT 46.7  08/10/2018   MCV 93.2 08/10/2018   PLT 290.0 08/10/2018   Lab Results  Component Value Date   NA 139 08/10/2018   K 4.4 08/10/2018   CO2 30 08/10/2018   GLUCOSE 67 (L) 08/10/2018   BUN 14 08/10/2018   CREATININE 1.03 08/10/2018   BILITOT 0.5 08/10/2018   ALKPHOS 77 08/10/2018   AST 18 08/10/2018   ALT 11 08/10/2018   PROT 6.8 08/10/2018   ALBUMIN 4.4 08/10/2018   CALCIUM 9.6 08/10/2018   ANIONGAP 9 12/26/2016   GFR 73.92 08/10/2018   Lab Results  Component Value Date   CHOL 227 (H) 08/10/2018   Lab Results  Component Value Date   HDL 35.70 (L) 08/10/2018   Lab Results  Component Value Date   LDLCALC 165 (H) 03/05/2017   Lab Results  Component Value Date   TRIG 389.0 (H) 08/10/2018   Lab Results  Component Value Date   CHOLHDL 6 08/10/2018   No results found for: HGBA1C     Assessment & Plan:   Problem List Items Addressed This Visit    Hyperlipidemia    Encouraged heart healthy diet, increase exercise, avoid trans fats, consider a krill oil cap daily      Relevant Orders   Lipid panel (Completed)   LDL cholesterol, direct (Completed)   Nephrolithiasis    Check cmp      Relevant Orders  Comprehensive metabolic panel (Completed)   Hypothyroidism    On Levothyroxine, continue to monitor      Relevant Orders   TSH (Completed)   Preventative health care    cologuard 2018, Patient encouraged to maintain heart healthy diet, regular exercise, adequate sleep. Consider daily probiotics. Take medications as prescribed. Labs reviewed      Peyronie's disease - Primary    Patient reported and started about 2 months ago. Referred to urology for further consideration      Relevant Orders   Ambulatory referral to Urology   Urinary hesitancy    Check urine culture and referred to urology      Relevant Orders   Ambulatory referral to Urology   CBC (Completed)   PSA (Completed)      I have discontinued Camelia Eng. Miyasaki's sildenafil. I am also  having him maintain his ALPRAZolam, ranitidine, and levothyroxine.  No orders of the defined types were placed in this encounter.   Penni Homans, MD

## 2018-08-10 NOTE — Assessment & Plan Note (Signed)
Check cmp 

## 2018-08-10 NOTE — Assessment & Plan Note (Addendum)
cologuard 2018, Patient encouraged to maintain heart healthy diet, regular exercise, adequate sleep. Consider daily probiotics. Take medications as prescribed. Labs reviewed

## 2018-08-10 NOTE — Assessment & Plan Note (Signed)
On Levothyroxine, continue to monitor 

## 2018-08-10 NOTE — Assessment & Plan Note (Signed)
Encouraged heart healthy diet, increase exercise, avoid trans fats, consider a krill oil cap daily 

## 2018-08-11 DIAGNOSIS — R3911 Hesitancy of micturition: Secondary | ICD-10-CM | POA: Insufficient documentation

## 2018-08-11 DIAGNOSIS — N486 Induration penis plastica: Secondary | ICD-10-CM | POA: Insufficient documentation

## 2018-08-11 LAB — LIPID PANEL
CHOL/HDL RATIO: 6
CHOLESTEROL: 227 mg/dL — AB (ref 0–200)
HDL: 35.7 mg/dL — ABNORMAL LOW (ref >39.00)
NonHDL: 191.41
Triglycerides: 389 mg/dL — ABNORMAL HIGH (ref 0.0–149.0)
VLDL: 77.8 mg/dL — AB (ref 0.0–40.0)

## 2018-08-11 LAB — CBC
HEMATOCRIT: 46.7 % (ref 39.0–52.0)
Hemoglobin: 16 g/dL (ref 13.0–17.0)
MCHC: 34.3 g/dL (ref 30.0–36.0)
MCV: 93.2 fl (ref 78.0–100.0)
PLATELETS: 290 10*3/uL (ref 150.0–400.0)
RBC: 5.02 Mil/uL (ref 4.22–5.81)
RDW: 13.9 % (ref 11.5–15.5)
WBC: 6.5 10*3/uL (ref 4.0–10.5)

## 2018-08-11 LAB — COMPREHENSIVE METABOLIC PANEL
ALT: 11 U/L (ref 0–53)
AST: 18 U/L (ref 0–37)
Albumin: 4.4 g/dL (ref 3.5–5.2)
Alkaline Phosphatase: 77 U/L (ref 39–117)
BUN: 14 mg/dL (ref 6–23)
CHLORIDE: 102 meq/L (ref 96–112)
CO2: 30 mEq/L (ref 19–32)
Calcium: 9.6 mg/dL (ref 8.4–10.5)
Creatinine, Ser: 1.03 mg/dL (ref 0.40–1.50)
GFR: 73.92 mL/min (ref >60.00)
GLUCOSE: 67 mg/dL — AB (ref 70–99)
POTASSIUM: 4.4 meq/L (ref 3.5–5.1)
SODIUM: 139 meq/L (ref 135–145)
Total Bilirubin: 0.5 mg/dL (ref 0.2–1.2)
Total Protein: 6.8 g/dL (ref 6.0–8.3)

## 2018-08-11 LAB — LDL CHOLESTEROL, DIRECT: Direct LDL: 165 mg/dL

## 2018-08-11 LAB — TSH: TSH: 15.73 u[IU]/mL — AB (ref 0.35–4.50)

## 2018-08-11 LAB — PSA: PSA: 0.78 ng/mL (ref 0.10–4.00)

## 2018-08-11 NOTE — Assessment & Plan Note (Signed)
Patient reported and started about 2 months ago. Referred to urology for further consideration

## 2018-08-11 NOTE — Assessment & Plan Note (Signed)
Check urine culture and referred to urology

## 2019-02-10 ENCOUNTER — Ambulatory Visit: Payer: 59 | Admitting: Family Medicine

## 2021-08-01 NOTE — Progress Notes (Signed)
Letter not mailed to patient.   LOV with PCP was 2020
